# Patient Record
Sex: Female | Born: 1985 | Hispanic: Yes | Marital: Married | Smoking: Never smoker
Health system: Southern US, Community
[De-identification: ages and names within clinical notes are randomized; demographics above are authoritative.]

## PROBLEM LIST (undated history)

## (undated) ENCOUNTER — Inpatient Hospital Stay (HOSPITAL_COMMUNITY): Payer: Self-pay

## (undated) DIAGNOSIS — R0602 Shortness of breath: Secondary | ICD-10-CM

## (undated) DIAGNOSIS — F329 Major depressive disorder, single episode, unspecified: Secondary | ICD-10-CM

## (undated) DIAGNOSIS — F32A Depression, unspecified: Secondary | ICD-10-CM

## (undated) HISTORY — DX: Shortness of breath: R06.02

## (undated) HISTORY — DX: Depression, unspecified: F32.A

## (undated) HISTORY — DX: Major depressive disorder, single episode, unspecified: F32.9

---

## 2005-08-07 ENCOUNTER — Inpatient Hospital Stay (HOSPITAL_COMMUNITY): Admission: AD | Admit: 2005-08-07 | Discharge: 2005-08-07 | Payer: Self-pay | Admitting: Obstetrics and Gynecology

## 2006-03-30 ENCOUNTER — Inpatient Hospital Stay (HOSPITAL_COMMUNITY): Admission: AD | Admit: 2006-03-30 | Discharge: 2006-04-02 | Payer: Self-pay | Admitting: Obstetrics

## 2006-05-21 ENCOUNTER — Emergency Department (HOSPITAL_COMMUNITY): Admission: EM | Admit: 2006-05-21 | Discharge: 2006-05-21 | Payer: Self-pay | Admitting: Emergency Medicine
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
## 2010-12-06 ENCOUNTER — Inpatient Hospital Stay (HOSPITAL_COMMUNITY): Payer: Self-pay

## 2010-12-06 ENCOUNTER — Inpatient Hospital Stay (HOSPITAL_COMMUNITY)
Admission: AD | Admit: 2010-12-06 | Discharge: 2010-12-06 | Disposition: A | Discharge status: Home / Self Care | Payer: Self-pay | Source: Ambulatory Visit | Attending: Obstetrics & Gynecology | Admitting: Obstetrics & Gynecology

## 2010-12-06 ENCOUNTER — Encounter (HOSPITAL_COMMUNITY): Payer: Self-pay | Admitting: *Deleted

## 2010-12-06 DIAGNOSIS — R109 Unspecified abdominal pain: Secondary | ICD-10-CM | POA: Insufficient documentation

## 2010-12-06 DIAGNOSIS — T8332XA Displacement of intrauterine contraceptive device, initial encounter: Secondary | ICD-10-CM

## 2010-12-06 DIAGNOSIS — Z30431 Encounter for routine checking of intrauterine contraceptive device: Secondary | ICD-10-CM | POA: Insufficient documentation

## 2010-12-06 LAB — WET PREP, GENITAL: Clue Cells Wet Prep HPF POC: NONE SEEN

## 2010-12-06 LAB — URINALYSIS, ROUTINE W REFLEX MICROSCOPIC
Bilirubin Urine: NEGATIVE
Protein, ur: NEGATIVE mg/dL
Specific Gravity, Urine: 1.01 (ref 1.005–1.030)
Urobilinogen, UA: 0.2 mg/dL (ref 0.0–1.0)

## 2010-12-06 LAB — URINE MICROSCOPIC-ADD ON

## 2010-12-06 LAB — POCT PREGNANCY, URINE: Preg Test, Ur: NEGATIVE

## 2010-12-06 MED ORDER — KETOROLAC TROMETHAMINE 60 MG/2ML IM SOLN
60.0000 mg | Freq: Once | INTRAMUSCULAR | Status: AC
  Administered 2010-12-06: 60 mg via INTRAMUSCULAR
  Filled 2010-12-06: qty 2

## 2010-12-06 MED ORDER — CIPROFLOXACIN HCL 500 MG PO TABS: 500.0000 mg | ORAL_TABLET | Freq: Two times a day (BID) | ORAL | Status: DC

## 2010-12-06 MED ORDER — IBUPROFEN 200 MG PO TABS: 600.0000 mg | ORAL_TABLET | Freq: Four times a day (QID) | ORAL | Status: DC | PRN

## 2010-12-06 MED ORDER — HYDROCODONE-ACETAMINOPHEN 5-325 MG PO TABS: 2.0000 | ORAL_TABLET | ORAL | Status: AC | PRN

## 2010-12-06 NOTE — ED Provider Notes (Signed)
History   Pt presents today c/o lower abd pain and cramping that has worsened over the past 4 months. She states she thought her IUD was causing the pain so she went to the HD to have it removed but they could not find the strings. She states the pain has progressed and is especially bad during her menses and is located mainly on her left side. She is currently on her menses and she desires to have her IUD removed. She denies fever, vag irritation, or any other sx at this time.  Chief Complaint  Patient presents with  . Abdominal Pain   HPI  OB History    Grav Para Term Preterm Abortions TAB SAB Ect Mult Living   1 1 1       1       Past Medical History  Diagnosis Date  . No pertinent past medical history     Past Surgical History  Procedure Date  . Cesarean section     No family history on file.  History  Substance Use Topics  . Smoking status: Never Smoker   . Smokeless tobacco: Not on file  . Alcohol Use: No    Allergies: No Known Allergies  Prescriptions prior to admission  Medication Sig Dispense Refill  . ibuprofen (ADVIL,MOTRIN) 200 MG tablet Take 200 mg by mouth every 6 (six) hours as needed. Pain in stomach area         Review of Systems  Constitutional: Negative for fever.  Cardiovascular: Negative for chest pain.  Gastrointestinal: Positive for abdominal pain. Negative for nausea, vomiting, diarrhea and constipation.  Genitourinary: Negative for dysuria, urgency, frequency and hematuria.  Neurological: Negative for dizziness and headaches.  Psychiatric/Behavioral: Negative for depression and suicidal ideas.   Physical Exam   Blood pressure 99/58, pulse 71, temperature 98.9 F (37.2 C), temperature source Oral, resp. rate 18, height 4\' 10"  (1.473 m), weight 144 lb (65.318 kg), last menstrual period 12/02/2010, SpO2 99.00%.  Physical Exam  Nursing note and vitals reviewed. Constitutional: She is oriented to person, place, and time. She appears  well-developed and well-nourished. No distress.  HENT:  Head: Normocephalic and atraumatic.  Eyes: EOM are normal. Pupils are equal, round, and reactive to light.  GI: Soft. She exhibits no distension. There is tenderness (Pt tender over uterus.). There is no rebound and no guarding.  Genitourinary: There is bleeding around the vagina. Vaginal discharge found.       Cervix easily visualized. IUD strings not seen. Uterus tender to palpation. No adnexal masses.  Neurological: She is alert and oriented to person, place, and time.  Skin: Skin is warm and dry. She is not diaphoretic.  Psychiatric: She has a normal mood and affect. Her behavior is normal. Judgment and thought content normal.    MAU Course  Procedures  US Transvaginal Non-ob  12/06/2010  *RADIOLOGY REPORT*  Clinical Data: Pelvic pain.  Patient with IUD.  TRANSABDOMINAL AND TRANSVAGINAL ULTRASOUND OF PELVIS Technique:  Both transabdominal and transvaginal ultrasound examinations of the pelvis were performed. Transabdominal technique was performed for global imaging of the pelvis including uterus, ovaries, adnexal regions, and pelvic cul-de-sac.  Comparison: None.   It was necessary to proceed with endovaginal exam following the transabdominal exam to visualize the ovaries.  Findings:  Uterus: Normal in size and appearance.  Endometrium: IUD in place and appears probably positioned. Endometrial stripe thickness 3.2 mm.  Right ovary:  Normal appearance/no adnexal mass  Left ovary: Normal appearance/no adnexal mass  Other findings: No free fluid  IMPRESSION: Normal study. No evidence of pelvic mass or other significant abnormality. IUD noted.  Original Report Authenticated By: Bernadene Bell. Maricela Curet, M.D.   US Pelvis Complete  12/06/2010  *RADIOLOGY REPORT*  Clinical Data: Pelvic pain.  Patient with IUD.  TRANSABDOMINAL AND TRANSVAGINAL ULTRASOUND OF PELVIS Technique:  Both transabdominal and transvaginal ultrasound examinations of the pelvis  were performed. Transabdominal technique was performed for global imaging of the pelvis including uterus, ovaries, adnexal regions, and pelvic cul-de-sac.  Comparison: None.   It was necessary to proceed with endovaginal exam following the transabdominal exam to visualize the ovaries.  Findings:  Uterus: Normal in size and appearance.  Endometrium: IUD in place and appears probably positioned. Endometrial stripe thickness 3.2 mm.  Right ovary:  Normal appearance/no adnexal mass  Left ovary: Normal appearance/no adnexal mass  Other findings: No free fluid  IMPRESSION: Normal study. No evidence of pelvic mass or other significant abnormality. IUD noted.  Original Report Authenticated By: Bernadene Bell. Maricela Curet, M.D.   IUD removal was attempted but unsuccessful. Dr. Emelda Fear notified and will attempt removal.  Pt pain is now zero following IM Toradol.  Assessment and Plan  Care of this pt was transferred to Georges Mouse, CNM at 8:14pm.  Clinton Gallant. Rice III, DrHSc, MPAS, PA-C  12/06/2010, 8:09 PM   Henrietta Hoover, PA 12/06/10 2014

## 2010-12-06 NOTE — Progress Notes (Signed)
IUD placed July,2008.  Pain down legs, and in lower abd.  Feels like bloating.  Went to [facility]- asked them to remove it- unable to see strings.  Hurts when walks.

## 2010-12-06 NOTE — ED Provider Notes (Signed)
Chief Complaint:  Abdominal Pain   Megan Buchanan is  25 y.o. G1P1001.  Patient's last menstrual period was 12/02/2010.Marland Kitchen  Her pregnancy status is negative.  She presents complaining of Abdominal Pain . As described by Dr. Henrietta Hoover, patient desires IUD  to be removed timeout is performed and verbal consent obtained the patient with a nurse as witness been explained to the patient including plans for paracervical and intracervical block patient denied allergies.. Cervix was cleansed and intracervical block placed on the anterior lip  of the cervix. single-tooth tenaculum used to grasp the cervix without discomfort. Paracervical block using 20 cc of lidocaine 1% used at 3,5,7, 9:00, patient none allowed probing of the uterine cavity with uterine packing forceps area in mild discomfort was all she experienced but at no time could the IUD string or IUD itself the identified and grasped with the uterine packing forceps. After several minutes of attempts to 7 cm in depth, procedure was discontinued patient tolerated the procedure well with no complications and 15-25 cc blood loss Cipro 500 mg orally will be administered prior to discharge patient will be given pain medications and scheduled for followup at the GYN clinic   Past Medical History  Diagnosis Date  . No pertinent past medical history     Past Surgical History  Procedure Date  . Cesarean section     No family history on file.  History  Substance Use Topics  . Smoking status: Never Smoker   . Smokeless tobacco: Not on file  . Alcohol Use: No    Allergies: No Known Allergies  Prescriptions prior to admission  Medication Sig Dispense Refill  . ibuprofen (ADVIL,MOTRIN) 200 MG tablet Take 200 mg by mouth every 6 (six) hours as needed. Pain in stomach area         Review of Systems - Negative except as listed by hpi  Physical Exam   Blood pressure 99/58, pulse 71, temperature 98.9 F (37.2 C), temperature source Oral,  resp. rate 18, height 4\' 10"  (1.473 m), weight 65.318 kg (144 lb), last menstrual period 12/02/2010, SpO2 99.00%.  General: General appearance - alert, well appearing, and in no distress Chest - clear to auscultation, no wheezes, rales or rhonchi, symmetric air entry HAbdomen - soft, nontender, nondistended, no masses or organomegaly no rebound tenderness noted Pelvic - VULVA: normal appearing vulva with no masses, tenderness or lesions Extremities - peripheral pulses normal, no pedal edema, no clubbing or cyanosis, not examined  Ultrasound reveals a retroverted retroflexed uterus with IUD in position cervix was show evidence of identifiable IUD string. Labs: Recent Results (from the past 24 hour(s))  URINALYSIS, ROUTINE W REFLEX MICROSCOPIC   Collection Time   12/06/10  4:25 PM      Component Value Range   Color, Urine YELLOW  YELLOW    Appearance HAZY (*) CLEAR    Specific Gravity, Urine 1.010  1.005 - 1.030    pH 7.0  5.0 - 8.0    Glucose, UA NEGATIVE  NEGATIVE (mg/dL)   Hgb urine dipstick LARGE (*) NEGATIVE    Bilirubin Urine NEGATIVE  NEGATIVE    Ketones, ur NEGATIVE  NEGATIVE (mg/dL)   Protein, ur NEGATIVE  NEGATIVE (mg/dL)   Urobilinogen, UA 0.2  0.0 - 1.0 (mg/dL)   Nitrite NEGATIVE  NEGATIVE    Leukocytes, UA TRACE (*) NEGATIVE   URINE MICROSCOPIC-ADD ON   Collection Time   12/06/10  4:25 PM      Component Value Range  Squamous Epithelial / LPF FEW (*) RARE    WBC, UA 0-2  <3 (WBC/hpf)   Bacteria, UA FEW (*) RARE   WET PREP, GENITAL   Collection Time   12/06/10  4:50 PM      Component Value Range   Yeast, Wet Prep NONE SEEN  NONE SEEN    Trich, Wet Prep NONE SEEN  NONE SEEN    Clue Cells, Wet Prep NONE SEEN  NONE SEEN    WBC, Wet Prep HPF POC FEW (*) NONE SEEN   POCT PREGNANCY, URINE   Collection Time   12/06/10  4:57 PM      Component Value Range   Preg Test, Ur NEGATIVE     Imaging Studies:  US Transvaginal Non-ob  12/06/2010  *RADIOLOGY REPORT*   Clinical Data: Pelvic pain.  Patient with IUD.  TRANSABDOMINAL AND TRANSVAGINAL ULTRASOUND OF PELVIS Technique:  Both transabdominal and transvaginal ultrasound examinations of the pelvis were performed. Transabdominal technique was performed for global imaging of the pelvis including uterus, ovaries, adnexal regions, and pelvic cul-de-sac.  Comparison: None.   It was necessary to proceed with endovaginal exam following the transabdominal exam to visualize the ovaries.  Findings:  Uterus: Normal in size and appearance.  Endometrium: IUD in place and appears probably positioned. Endometrial stripe thickness 3.2 mm.  Right ovary:  Normal appearance/no adnexal mass  Left ovary: Normal appearance/no adnexal mass  Other findings: No free fluid  IMPRESSION: Normal study. No evidence of pelvic mass or other significant abnormality. IUD noted.  Original Report Authenticated By: Bernadene Bell. Maricela Curet, M.D.   US Pelvis Complete  12/06/2010  *RADIOLOGY REPORT*  Clinical Data: Pelvic pain.  Patient with IUD.  TRANSABDOMINAL AND TRANSVAGINAL ULTRASOUND OF PELVIS Technique:  Both transabdominal and transvaginal ultrasound examinations of the pelvis were performed. Transabdominal technique was performed for global imaging of the pelvis including uterus, ovaries, adnexal regions, and pelvic cul-de-sac.  Comparison: None.   It was necessary to proceed with endovaginal exam following the transabdominal exam to visualize the ovaries.  Findings:  Uterus: Normal in size and appearance.  Endometrium: IUD in place and appears probably positioned. Endometrial stripe thickness 3.2 mm.  Right ovary:  Normal appearance/no adnexal mass  Left ovary: Normal appearance/no adnexal mass  Other findings: No free fluid  IMPRESSION: Normal study. No evidence of pelvic mass or other significant abnormality. IUD noted.  Original Report Authenticated By: Bernadene Bell. Maricela Curet, M.D.     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   Plan: Plan Cipro 500 mg orally administered due to uterine manipulation Followup in GYN clinic Motrin 600 mg x30 tablets prescribed as well as hydrocodone 5/325 for an intermittent discomfort   Carita Sollars V

## 2010-12-06 NOTE — Progress Notes (Signed)
Frequency with urination, painful, feels better after

## 2010-12-06 NOTE — Progress Notes (Signed)
Pain past 3 months- building

## 2010-12-11 NOTE — ED Provider Notes (Signed)
Attestation of Attending Supervision of Advanced Practitioner: Evaluation and management procedures were performed by the PA/NP/CNM/OB Fellow under my supervision/collaboration. Chart reviewed and agree with management and plan. I attempted to locate the IUD after intracervical and paracervical block.  Uterus was retroverted, sounded to 7 cm, without any success identifying the IUD with the Uterine Packing/Grasping forceps. See note in [facility] completion report. Tilda Burrow 12/11/2010 6:36 PM

## 2011-01-17 ENCOUNTER — Ambulatory Visit (INDEPENDENT_AMBULATORY_CARE_PROVIDER_SITE_OTHER): Payer: Self-pay | Admitting: Obstetrics & Gynecology

## 2011-01-17 ENCOUNTER — Encounter: Payer: Self-pay | Admitting: Family

## 2011-01-17 VITALS — BP 131/82 | HR 93 | Temp 97.5°F | Ht 59.0 in | Wt 145.2 lb

## 2011-01-17 DIAGNOSIS — T839XXA Unspecified complication of genitourinary prosthetic device, implant and graft, initial encounter: Secondary | ICD-10-CM

## 2011-01-17 DIAGNOSIS — Z30432 Encounter for removal of intrauterine contraceptive device: Secondary | ICD-10-CM

## 2011-01-17 DIAGNOSIS — T8389XA Other specified complication of genitourinary prosthetic devices, implants and grafts, initial encounter: Secondary | ICD-10-CM

## 2011-01-17 MED ORDER — IBUPROFEN 200 MG PO TABS
600.0000 mg | ORAL_TABLET | Freq: Once | ORAL | Status: AC
  Administered 2011-01-17: 600 mg via ORAL

## 2011-01-17 NOTE — Progress Notes (Signed)
History:  26 y.o. G1P1001 here today for IUD removal.  She has had the IUD in for about 5 years, it was unable to be removed in the [facility] and MAU.  She is on OCPs for contraception. IUD was visualized in the uterus on 12/06/10 scan. Spanish interpreter present for this encounter.  The following portions of the patient's history were reviewed and updated as appropriate: allergies, current medications, past family history, past medical history, past social history, past surgical history and problem list.   Objective:  Physical Exam Blood pressure 131/82, pulse 93, temperature 97.5 F (36.4 C), temperature source Oral, height 4\' 11"  (1.499 m), weight 145 lb 3.2 oz (65.862 kg), last menstrual period 01/04/2011. Gen: NAD Abd: Soft, nontender and nondistended Pelvic: Normal appearing external genitalia; normal appearing vaginal mucosa and cervix.  Normal discharge.  Small uterus, no palpable masses or adnexal tenderness.  IUD Removal Attempt Patient was in the dorsal lithotomy position, normal external genitalia was noted.  A speculum was placed in the patient's vagina, normal discharge was noted, no lesions. The multiparous cervix was visualized, no lesions, no abnormal discharge, and was swabbed with Betadine using scopettes.  The strings of the IUD were not visualized.  Kelly forceps were introduced into the uterine cavity but the IUD was not palpated.  The IUD hook was also used, the IUD was palpated but was unable to grasped and pulled.  Multiple attempts were made but were unsuccessful.  Patient had a significant amount of pain and discomfort during the procedure, the procedure was aborted. Will have to re-attempt this removal under anesthesia in the OR.  Ibuprofen 600 mg po x1 given to patient for her pain.  Labs and Imaging 12/06/10 TRANSABDOMINAL AND TRANSVAGINAL ULTRASOUND OF PELVIS  Uterus: Normal in size and appearance. Endometrium: IUD in place and appears probably positioned. Endometrial  stripe thickness 3.2 mm.  Right ovary: Normal appearance/no adnexal mass.  Left ovary: Normal appearance/no adnexal mass. Other findings: No free fluid  IMPRESSION: Normal study. No evidence of pelvic mass or other significant abnormality. IUD noted.  Assessment & Plan:  Patient will be scheduled for hysteroscopy, removal of retained IUD in the OR soon.  Risks of procedure reviewed. She will be contacted by our scheduler with the time and date of the procedure.

## 2011-01-22 ENCOUNTER — Encounter (HOSPITAL_COMMUNITY)
Admission: RE | Admit: 2011-01-22 | Discharge: 2011-01-22 | Disposition: A | Discharge status: Home / Self Care | Payer: Self-pay | Source: Ambulatory Visit | Attending: Obstetrics & Gynecology | Admitting: Obstetrics & Gynecology

## 2011-01-22 ENCOUNTER — Encounter (HOSPITAL_COMMUNITY): Payer: Self-pay

## 2011-01-22 LAB — CBC
Hemoglobin: 13 g/dL (ref 12.0–15.0)
MCHC: 35.9 g/dL (ref 30.0–36.0)
Platelets: 260 10*3/uL (ref 150–400)
RDW: 12.8 % (ref 11.5–15.5)

## 2011-01-22 NOTE — Patient Instructions (Addendum)
   Your procedure is scheduled on: Wednesday, Jan 9th  Enter through the [employer] of [facility] at: 9:45am  Pick up the phone at the desk and dial [phone] and inform us of your arrival.  Please call this number if you have any problems the morning of surgery: [phone]  Remember: Do not eat food after midnight: Tuesday Do not drink clear liquids after:  Tuesdaty Take these medicines the morning of surgery with a SIP OF WATER: none  Do not wear jewelry, make-up, or FINGER nail polish Do not wear lotions, powders, perfumes or deodorant. Do not shave 48 hours prior to surgery. Do not bring valuables to the hospital.  Patients discharged on the day of surgery will not be allowed to drive home.   Home with [name]  cell [phone].   Remember to use your hibiclens as instructed.Please shower with 1/2 bottle the evening before your surgery and the other 1/2 bottle the morning of surgery.

## 2011-01-22 NOTE — Pre-Procedure Instructions (Signed)
Ok to see patient DOS. 

## 2011-01-24 ENCOUNTER — Encounter (HOSPITAL_COMMUNITY): Payer: Self-pay | Admitting: *Deleted

## 2011-01-24 ENCOUNTER — Ambulatory Visit (HOSPITAL_COMMUNITY)
Admission: RE | Admit: 2011-01-24 | Discharge: 2011-01-24 | Disposition: A | Discharge status: Home / Self Care | Payer: Self-pay | Source: Ambulatory Visit | Attending: Obstetrics & Gynecology | Admitting: Obstetrics & Gynecology

## 2011-01-24 ENCOUNTER — Encounter (HOSPITAL_COMMUNITY): Payer: Self-pay | Admitting: Anesthesiology

## 2011-01-24 ENCOUNTER — Ambulatory Visit (HOSPITAL_COMMUNITY): Payer: Self-pay | Admitting: Anesthesiology

## 2011-01-24 ENCOUNTER — Encounter (HOSPITAL_COMMUNITY)
Admission: RE | Disposition: A | Discharge status: Home / Self Care | Payer: Self-pay | Source: Ambulatory Visit | Attending: Obstetrics & Gynecology

## 2011-01-24 DIAGNOSIS — Z30432 Encounter for removal of intrauterine contraceptive device: Secondary | ICD-10-CM | POA: Insufficient documentation

## 2011-01-24 DIAGNOSIS — T839XXA Unspecified complication of genitourinary prosthetic device, implant and graft, initial encounter: Secondary | ICD-10-CM

## 2011-01-24 DIAGNOSIS — T8389XA Other specified complication of genitourinary prosthetic devices, implants and grafts, initial encounter: Secondary | ICD-10-CM

## 2011-01-24 DIAGNOSIS — Z01812 Encounter for preprocedural laboratory examination: Secondary | ICD-10-CM | POA: Insufficient documentation

## 2011-01-24 DIAGNOSIS — Z01818 Encounter for other preprocedural examination: Secondary | ICD-10-CM | POA: Insufficient documentation

## 2011-01-24 HISTORY — PX: IUD REMOVAL: SHX5392

## 2011-01-24 HISTORY — PX: HYSTEROSCOPY W/D&C: SHX1775

## 2011-01-24 LAB — PREGNANCY, URINE: Preg Test, Ur: NEGATIVE

## 2011-01-24 SURGERY — DILATATION AND CURETTAGE /HYSTEROSCOPY
Anesthesia: General | Site: Uterus | Wound class: Clean Contaminated

## 2011-01-24 MED ORDER — OXYCODONE-ACETAMINOPHEN 5-325 MG PO TABS: 1.0000 | ORAL_TABLET | Freq: Four times a day (QID) | ORAL | Status: AC | PRN

## 2011-01-24 MED ORDER — LIDOCAINE HCL (CARDIAC) 20 MG/ML IV SOLN
INTRAVENOUS | Status: AC
  Filled 2011-01-24: qty 5

## 2011-01-24 MED ORDER — MIDAZOLAM HCL 2 MG/2ML IJ SOLN
INTRAMUSCULAR | Status: AC
  Filled 2011-01-24: qty 2

## 2011-01-24 MED ORDER — ONDANSETRON HCL 4 MG/2ML IJ SOLN
INTRAMUSCULAR | Status: AC
  Filled 2011-01-24: qty 2

## 2011-01-24 MED ORDER — MIDAZOLAM HCL 5 MG/5ML IJ SOLN
INTRAMUSCULAR | Status: DC | PRN
  Administered 2011-01-24: 2 mg via INTRAVENOUS

## 2011-01-24 MED ORDER — DEXAMETHASONE SODIUM PHOSPHATE 10 MG/ML IJ SOLN
INTRAMUSCULAR | Status: AC
  Filled 2011-01-24: qty 1

## 2011-01-24 MED ORDER — PROPOFOL 10 MG/ML IV EMUL
INTRAVENOUS | Status: AC
  Filled 2011-01-24: qty 20

## 2011-01-24 MED ORDER — DOCUSATE SODIUM 100 MG PO CAPS: 100.0000 mg | ORAL_CAPSULE | Freq: Two times a day (BID) | ORAL | Status: AC | PRN

## 2011-01-24 MED ORDER — IBUPROFEN 600 MG PO TABS: 600.0000 mg | ORAL_TABLET | Freq: Four times a day (QID) | ORAL | Status: DC | PRN

## 2011-01-24 MED ORDER — EPHEDRINE 5 MG/ML INJ
INTRAVENOUS | Status: AC
  Filled 2011-01-24: qty 10

## 2011-01-24 MED ORDER — FENTANYL CITRATE 0.05 MG/ML IJ SOLN
INTRAMUSCULAR | Status: AC
  Filled 2011-01-24: qty 2

## 2011-01-24 MED ORDER — BUPIVACAINE HCL (PF) 0.25 % IJ SOLN
INTRAMUSCULAR | Status: DC | PRN
  Administered 2011-01-24: 30 mL

## 2011-01-24 MED ORDER — EPHEDRINE SULFATE 50 MG/ML IJ SOLN
INTRAMUSCULAR | Status: DC | PRN
  Administered 2011-01-24 (×2): 10 mg via INTRAVENOUS

## 2011-01-24 MED ORDER — FENTANYL CITRATE 0.05 MG/ML IJ SOLN
INTRAMUSCULAR | Status: AC
  Filled 2011-01-24: qty 5

## 2011-01-24 MED ORDER — GLYCINE 1.5 % IR SOLN
Status: DC | PRN
  Administered 2011-01-24: 3000 mL

## 2011-01-24 MED ORDER — PROPOFOL 10 MG/ML IV EMUL
INTRAVENOUS | Status: DC | PRN
  Administered 2011-01-24: 200 mg via INTRAVENOUS

## 2011-01-24 MED ORDER — LIDOCAINE HCL (CARDIAC) 20 MG/ML IV SOLN
INTRAVENOUS | Status: DC | PRN
  Administered 2011-01-24: 100 mg via INTRAVENOUS

## 2011-01-24 MED ORDER — LACTATED RINGERS IV SOLN
INTRAVENOUS | Status: DC
  Administered 2011-01-24: 12:00:00 via INTRAVENOUS
  Administered 2011-01-24: 125 mL/h via INTRAVENOUS
  Administered 2011-01-24: 11:00:00 via INTRAVENOUS

## 2011-01-24 MED ORDER — DEXAMETHASONE SODIUM PHOSPHATE 10 MG/ML IJ SOLN
INTRAMUSCULAR | Status: DC | PRN
  Administered 2011-01-24: 10 mg via INTRAVENOUS

## 2011-01-24 MED ORDER — KETOROLAC TROMETHAMINE 30 MG/ML IJ SOLN
INTRAMUSCULAR | Status: AC
  Filled 2011-01-24: qty 1

## 2011-01-24 MED ORDER — LACTATED RINGERS IV SOLN: INTRAVENOUS | Status: DC

## 2011-01-24 MED ORDER — FENTANYL CITRATE 0.05 MG/ML IJ SOLN
INTRAMUSCULAR | Status: DC | PRN
  Administered 2011-01-24: 100 ug via INTRAVENOUS

## 2011-01-24 SURGICAL SUPPLY — 14 items
CANISTER SUCTION 2500CC (MISCELLANEOUS) ×2 IMPLANT
CATH ROBINSON RED A/P 16FR (CATHETERS) ×2 IMPLANT
CLOTH BEACON ORANGE TIMEOUT ST (SAFETY) ×2 IMPLANT
CONTAINER PREFILL 10% NBF 60ML (FORM) ×3 IMPLANT
DRAPE PROXIMA HALF (DRAPES) ×2 IMPLANT
ELECT LOOP GYNE PRO 24FR (CUTTING LOOP)
ELECTRODE LOOP GYNE PRO 24FR (CUTTING LOOP) IMPLANT
GLOVE BIO SURGEON STRL SZ7 (GLOVE) ×2 IMPLANT
GOWN PREVENTION PLUS LG XLONG (DISPOSABLE) ×3 IMPLANT
GOWN STRL REIN XL XLG (GOWN DISPOSABLE) ×2 IMPLANT
LOOP ANGLED CUTTING 22FR (CUTTING LOOP) IMPLANT
PACK HYSTEROSCOPY LF (CUSTOM PROCEDURE TRAY) ×2 IMPLANT
TOWEL OR 17X24 6PK STRL BLUE (TOWEL DISPOSABLE) ×4 IMPLANT
WATER STERILE IRR 1000ML POUR (IV SOLUTION) ×2 IMPLANT

## 2011-01-24 NOTE — Interval H&P Note (Signed)
History and Physical Interval Note:  01/24/2011 9:15 AM  Megan Buchanan  has presented today for surgery, with the diagnosis of Retained IUD. She is not pregnant. Spanish interpreter present during this encounter. After consideration of risks, benefits and other options for treatment, the patient has consented to  Procedure(s): Hysteroscopy, removal of retained IUD as a surgical intervention .  The patients' history has been reviewed, patient examined, no change in status, stable for surgery.  I have reviewed the patients' chart and labs.  Questions were answered to the patient's satisfaction.    Jaynie Collins A MD

## 2011-01-24 NOTE — Transfer of Care (Signed)
Immediate Anesthesia Transfer of Care Note  Patient: Megan Buchanan  Procedure(s) Performed:  DILATATION AND CURETTAGE /HYSTEROSCOPY - Hysteroscopy only; INTRAUTERINE DEVICE (IUD) REMOVAL  Patient Location: PACU  Anesthesia Type: General  Level of Consciousness: awake and alert   Airway & Oxygen Therapy: Patient connected to nasal cannula oxygen  Post-op Assessment: Report given to PACU RN  Post vital signs: Reviewed and stable  Complications: No apparent anesthesia complications

## 2011-01-24 NOTE — Op Note (Signed)
PREOPERATIVE DIAGNOSIS:  Retained Mirena IUD, failed attempts to remove it in office POSTOPERATIVE DIAGNOSIS: The same PROCEDURE: Operative Hysteroscopy, Removal of retained IUD SURGEON:  Dr. Jaynie Collins   INDICATIONS: 26 y.o. G1P1001  here for scheduled surgery for removal of retained IUD.   Risks of surgery were discussed with the patient including but not limited to: bleeding which may require transfusion; infection which may require antibiotics; injury to uterus or surrounding organs; need for additional procedures including laparotomy or laparoscopy; and other postoperative/anesthesia complications. Written informed consent was obtained.    FINDINGS:  A 10 week size uterus.  IUD seen slightly embedded in the upper fundal area, able to be removed using hysteroscopic graspers. Normal endometrium.  Normal ostia bilaterally.  ANESTHESIA:   General, paracervical block. INTRAVENOUS FLUIDS:  of LR FLUID DEFICITS:  of Glycine ESTIMATED BLOOD LOSS:  5 ml SPECIMENS: None COMPLICATIONS:  None immediate.  PROCEDURE DETAILS:  The patient was taken to the operating room where general anesthesia was administered and was found to be adequate.  After an adequate timeout was performed, she was placed in the dorsal lithotomy position and examined; then prepped and draped in the sterile manner.   Her bladder was catheterized for about 150 ml of clear, yellow urine. A speculum was then placed in the patient's vagina and a single tooth tenaculum was applied to the anterior lip of the cervix.   A paracervical block using 30 ml of 0.25% Marcaine was administered.  The cervix was sounded to 10 cm and dilated manually with metal dilators to accommodate the 5 mm diagnostic hysteroscope.  Once the cervix was dilated, the hysteroscope was inserted under direct visualization using glycine as a suspension medium.  The uterine cavity was carefully examined, with the findings as mentioned above.  Hysteroscopic  graspers were used to grasp the IUD and it was removed intact from the uterus.   After further careful visualization of the uterine cavity, the hysteroscope was removed under direct visualization.  The tenaculum was removed from the anterior lip of the cervix and the vaginal speculum was removed after noting good hemostasis.  The patient tolerated the procedure well and was taken to the recovery area awake, extubated and in stable condition.  The patient will be discharged to home as per PACU criteria.  Routine postoperative instructions given.  She was prescribed Percocet, Ibuprofen and Colace.

## 2011-01-24 NOTE — Anesthesia Preprocedure Evaluation (Addendum)
Anesthesia Evaluation  Patient identified by MRN, date of birth, ID band Patient awake    Reviewed: Allergy & Precautions, H&P , NPO status , Patient's Chart, lab work & pertinent test results  Airway Mallampati: I      Dental No notable dental hx.    Pulmonary neg pulmonary ROS,    Pulmonary exam normal       Cardiovascular neg cardio ROS     Neuro/Psych Negative Neurological ROS     GI/Hepatic negative GI ROS, Neg liver ROS,   Endo/Other  Negative Endocrine ROS  Renal/GU negative Renal ROS  Genitourinary negative   Musculoskeletal negative musculoskeletal ROS (+)   Abdominal Normal abdominal exam  (+)   Peds negative pediatric ROS (+)  Hematology negative hematology ROS (+)   Anesthesia Other Findings   Reproductive/Obstetrics negative OB ROS                           Anesthesia Physical Anesthesia Plan  ASA: I  Anesthesia Plan: General   Post-op Pain Management:    Induction:   Airway Management Planned: LMA  Additional Equipment:   Intra-op Plan:   Post-operative Plan:   Informed Consent:   Plan Discussed with:   Anesthesia Plan Comments:         Anesthesia Quick Evaluation

## 2011-01-24 NOTE — H&P (View-Only) (Signed)
History:  26 y.o. G1P1001 here today for IUD removal.  She has had the IUD in for about 5 years, it was unable to be removed in the [facility] and [facility].  She is on OCPs for contraception. IUD was visualized in the uterus on 12/06/10 scan. Spanish interpreter present for this encounter.  The following portions of the patient's history were reviewed and updated as appropriate: allergies, current medications, past family history, past medical history, past social history, past surgical history and problem list.   Objective:  Physical Exam Blood pressure 131/82, pulse 93, temperature 97.5 F (36.4 C), temperature source Oral, height 4' 11" (1.499 m), weight 145 lb 3.2 oz (65.862 kg), last menstrual period 01/04/2011. Gen: NAD Abd: Soft, nontender and nondistended Pelvic: Normal appearing external genitalia; normal appearing vaginal mucosa and cervix.  Normal discharge.  Small uterus, no palpable masses or adnexal tenderness.  IUD Removal Attempt Patient was in the dorsal lithotomy position, normal external genitalia was noted.  A speculum was placed in the patient's vagina, normal discharge was noted, no lesions. The multiparous cervix was visualized, no lesions, no abnormal discharge, and was swabbed with Betadine using scopettes.  The strings of the IUD were not visualized.  Kelly forceps were introduced into the uterine cavity but the IUD was not palpated.  The IUD hook was also used, the IUD was palpated but was unable to grasped and pulled.  Multiple attempts were made but were unsuccessful.  Patient had a significant amount of pain and discomfort during the procedure, the procedure was aborted. Will have to re-attempt this removal under anesthesia in the OR.  Ibuprofen 600 mg po x1 given to patient for her pain.  Labs and Imaging 12/06/10 TRANSABDOMINAL AND TRANSVAGINAL ULTRASOUND OF PELVIS  Uterus: Normal in size and appearance. Endometrium: IUD in place and appears probably positioned. Endometrial  stripe thickness 3.2 mm.  Right ovary: Normal appearance/no adnexal mass.  Left ovary: Normal appearance/no adnexal mass. Other findings: No free fluid  IMPRESSION: Normal study. No evidence of pelvic mass or other significant abnormality. IUD noted.  Assessment & Plan:  Patient will be scheduled for hysteroscopy, removal of retained IUD in the OR soon.  Risks of procedure reviewed. She will be contacted by our scheduler with the time and date of the procedure. 

## 2011-01-24 NOTE — Anesthesia Procedure Notes (Signed)
Procedure Name: LMA Insertion Date/Time: 01/24/2011 11:42 AM Performed by: Irean Kendricks MARIE Pre-anesthesia Checklist: Patient identified, Timeout performed, Emergency Drugs available, Suction available and Patient being monitored Patient Re-evaluated:Patient Re-evaluated prior to inductionOxygen Delivery Method: Circle System Utilized Intubation Type: IV induction LMA: LMA inserted LMA Size: 4.0 Dental Injury: Teeth and Oropharynx as per pre-operative assessment

## 2011-01-24 NOTE — Anesthesia Postprocedure Evaluation (Signed)
Anesthesia Post Note  Patient: Megan Buchanan  Procedure(s) Performed:  DILATATION AND CURETTAGE /HYSTEROSCOPY - Hysteroscopy only; INTRAUTERINE DEVICE (IUD) REMOVAL  Anesthesia type: General  Patient location: PACU  Post pain: Pain level controlled  Post assessment: Post-op Vital signs reviewed  Last Vitals:  Filed Vitals:   01/24/11 1230  BP: 108/70  Pulse: 97  Temp:   Resp: 16    Post vital signs: Reviewed  Level of consciousness: sedated  Complications: No apparent anesthesia complicationsfj

## 2011-01-25 ENCOUNTER — Encounter (HOSPITAL_COMMUNITY): Payer: Self-pay | Admitting: Obstetrics & Gynecology

## 2011-10-04 ENCOUNTER — Emergency Department (HOSPITAL_COMMUNITY)
Admission: EM | Admit: 2011-10-04 | Discharge: 2011-10-05 | Disposition: A | Discharge status: Home / Self Care | Payer: Self-pay | Attending: Emergency Medicine | Admitting: Emergency Medicine

## 2011-10-04 ENCOUNTER — Encounter (HOSPITAL_COMMUNITY): Payer: Self-pay | Admitting: *Deleted

## 2011-10-04 DIAGNOSIS — N72 Inflammatory disease of cervix uteri: Secondary | ICD-10-CM | POA: Insufficient documentation

## 2011-10-04 DIAGNOSIS — R109 Unspecified abdominal pain: Secondary | ICD-10-CM

## 2011-10-04 DIAGNOSIS — R1032 Left lower quadrant pain: Secondary | ICD-10-CM | POA: Insufficient documentation

## 2011-10-04 LAB — URINALYSIS, ROUTINE W REFLEX MICROSCOPIC
Hgb urine dipstick: NEGATIVE
Nitrite: NEGATIVE
Protein, ur: NEGATIVE mg/dL
Specific Gravity, Urine: 1.012 (ref 1.005–1.030)
Urobilinogen, UA: 0.2 mg/dL (ref 0.0–1.0)

## 2011-10-04 LAB — POCT PREGNANCY, URINE: Preg Test, Ur: NEGATIVE

## 2011-10-04 NOTE — ED Provider Notes (Signed)
History     CSN: [id number]  Arrival date & time 10/04/11  2114   First MD Initiated Contact with Patient 10/04/11 2337      Chief Complaint  Patient presents with  . Abdominal Pain    (Consider location/radiation/quality/duration/timing/severity/associated sxs/prior treatment) HPI  This patient is a generally healthy 26 year old gravida 1 para 1 female who presents with complaints of left lower abdominal pain for about the last 3-4 weeks. The patient states that she was seen at a local [facility] 4 weeks ago for her symptoms. She says that she was diagnosed with an infection in her ovary but did not undergo any ultrasound exam. She says that she was started on an antibiotic and took that antibiotic 3 times per day for 3 weeks. She cannot recall the name of the antibiotic. She says her symptoms felt improved but returned a few days after she finished her antibiotic.  The patient notes pain with both urination and defecation. She has not had any unusual vaginal discharge. She denies fever as well as nausea, vomiting and diarrhea. She denies history of pelvic inflammatory disease.    History reviewed. No pertinent past medical history.  Past Surgical History  Procedure Date  . Cesarean section   . Hysteroscopy w/d&c 01/24/2011    Procedure: DILATATION AND CURETTAGE /HYSTEROSCOPY;  Surgeon: Tereso Newcomer, MD;  Location: [facility];  Service: Gynecology;  Laterality: N/A;  Hysteroscopy only  . Iud removal 01/24/2011    Procedure: INTRAUTERINE DEVICE (IUD) REMOVAL;  Surgeon: Tereso Newcomer, MD;  Location: [facility];  Service: Gynecology;  Laterality: N/A;    No family history on file.  History  Substance Use Topics  . Smoking status: Never Smoker   . Smokeless tobacco: Never Used  . Alcohol Use: No    OB History    Grav Para Term Preterm Abortions TAB SAB Ect Mult Living   1 1 1       1       Review of Systems  Gen: no weight loss, fevers, chills, night  sweats Eyes: no discharge or drainage, no occular pain or visual changes Nose: no epistaxis or rhinorrhea Mouth: no dental pain, no sore throat Neck: no neck pain Lungs: no SOB, cough, wheezing CV: no chest pain, palpitations, dependent edema or orthopnea Abd: as noted above nausea, vomiting GU: as noted above no dysuria or gross hematuria MSK: no myalgias or arthralgias Neuro: no headache, no focal neurologic deficits Skin: no rash Psyche: negative..   Allergies  Review of patient's allergies indicates no known allergies.  Home Medications   Current Outpatient Rx  Name Route Sig Dispense Refill  . ACETAMINOPHEN 500 MG PO TABS Oral Take 500 mg by mouth every 6 (six) hours as needed. For pain      BP 104/64  Pulse 77  Temp 98.2 F (36.8 C) (Oral)  Resp 20  SpO2 99%  Physical Exam  Gen: well developed and well nourished appearing Head: NCAT Eyes: PERL, EOMI Nose: no epistaixis or rhinorrhea Mouth/throat: mucosa is moist and pink Neck: supple, no stridor Lungs: CTA B, no wheezing, rhonchi or rales CV: RRR, no murmur, ext well perfused.  Abd: soft, exquisitely tender to palpation over the LLQ, no RLQ ttp, mild LUQ ttp, nondistended Back: no ttp, no cva ttp Skin: no rashese, wnl Neuro: CN ii-xii grossly intact, no focal deficits Psyche; normal affect,  calm and cooperative.   Pelvic exam performed at approx 0315:   Normal female  external genitalia and vulva, some white discharge present in the vagina, no purulent discharge appreciated, cervix appears grossly normal. Patient complains of pain in the LLQ on bimanual exam and I am unable to exclude CMT based on this. The patient has pain in the LLQ with movement of the cervix on bimanual exam. No palpable masses.   ED Course  Procedures (including critical care time)  Patient treated symptomatically with percocet. CBC, CMP, U/A and u preg performed. Pelvic U/S performed by Radiology and interpreted by radiologist.     MDM  DDX: ovarian torsion, TOA, ectopic pregnancy, PID, cervicitis, UTI, colitis, enteritis, IBS, IBD, diverticulitis is much less likely given the patient's age.   ED work up is non-diagnostic with normal CBC, CMP, U/A and negative urine preg. TV and TA pelvic U/S normal. Bimanual exam notable for signs of possible cervicitis. GC and chlamydia cultures pending. We will tx empirically with Ceftriaxone and Azithromycin. Patient informed of results of study. Plan is for symptomatic management and outpatient f/u. Patient and husband counseled that she should return for worsening sx, fever or any other urgent health concerns.         Brandt Loosen, MD 10/05/11 [phone]

## 2011-10-04 NOTE — ED Notes (Signed)
Patient with lower abdominal pain that radiates to her back for about two weeks.  Patient also c/o burning sensation when she urinates.  Patient also c/o her butt hurting.

## 2011-10-05 ENCOUNTER — Emergency Department (HOSPITAL_COMMUNITY): Payer: Self-pay

## 2011-10-05 LAB — CBC WITH DIFFERENTIAL/PLATELET
Basophils Absolute: 0 10*3/uL (ref 0.0–0.1)
Basophils Relative: 0 % (ref 0–1)
Eosinophils Relative: 1 % (ref 0–5)
HCT: 35 % — ABNORMAL LOW (ref 36.0–46.0)
Lymphocytes Relative: 45 % (ref 12–46)
MCHC: 35.7 g/dL (ref 30.0–36.0)
MCV: 87.5 fL (ref 78.0–100.0)
Monocytes Absolute: 0.7 10*3/uL (ref 0.1–1.0)
Neutro Abs: 3.2 10*3/uL (ref 1.7–7.7)
Platelets: 246 10*3/uL (ref 150–400)
RDW: 12.8 % (ref 11.5–15.5)
WBC: 7.1 10*3/uL (ref 4.0–10.5)

## 2011-10-05 LAB — COMPREHENSIVE METABOLIC PANEL
ALT: 43 U/L — ABNORMAL HIGH (ref 0–35)
AST: 30 U/L (ref 0–37)
Albumin: 4 g/dL (ref 3.5–5.2)
Calcium: 9.9 mg/dL (ref 8.4–10.5)
Creatinine, Ser: 0.69 mg/dL (ref 0.50–1.10)
Sodium: 137 mEq/L (ref 135–145)
Total Protein: 7.3 g/dL (ref 6.0–8.3)

## 2011-10-05 LAB — WET PREP, GENITAL
Clue Cells Wet Prep HPF POC: NONE SEEN
Trich, Wet Prep: NONE SEEN
WBC, Wet Prep HPF POC: NONE SEEN

## 2011-10-05 MED ORDER — CEFTRIAXONE SODIUM 250 MG IJ SOLR
250.0000 mg | Freq: Once | INTRAMUSCULAR | Status: AC
  Administered 2011-10-05: 250 mg via INTRAMUSCULAR
  Filled 2011-10-05: qty 250

## 2011-10-05 MED ORDER — TRAMADOL HCL 50 MG PO TABS: 50.0000 mg | ORAL_TABLET | Freq: Four times a day (QID) | ORAL | Status: DC | PRN

## 2011-10-05 MED ORDER — OXYCODONE-ACETAMINOPHEN 5-325 MG PO TABS
2.0000 | ORAL_TABLET | Freq: Once | ORAL | Status: AC
  Administered 2011-10-05: 2 via ORAL
  Filled 2011-10-05: qty 1

## 2011-10-05 MED ORDER — LIDOCAINE HCL (PF) 1 % IJ SOLN
INTRAMUSCULAR | Status: AC
  Administered 2011-10-05: 2.5 mL
  Filled 2011-10-05: qty 5

## 2011-10-05 MED ORDER — MORPHINE SULFATE 4 MG/ML IJ SOLN: 4.0000 mg | Freq: Once | INTRAMUSCULAR | Status: DC

## 2011-10-05 MED ORDER — AZITHROMYCIN 250 MG PO TABS
1000.0000 mg | ORAL_TABLET | Freq: Once | ORAL | Status: AC
  Administered 2011-10-05: 1000 mg via ORAL
  Filled 2011-10-05: qty 4

## 2011-10-05 NOTE — ED Notes (Signed)
Pt to ultrasound at this time.

## 2011-10-05 NOTE — ED Notes (Signed)
Pt returned to room from ultrasound, no distress noted. 

## 2011-10-06 LAB — GC/CHLAMYDIA PROBE AMP, GENITAL
Chlamydia, DNA Probe: NEGATIVE
GC Probe Amp, Genital: NEGATIVE

## 2012-01-16 NOTE — L&D Delivery Note (Signed)
Delivery Note At 2:26 AM a healthy female was delivered via Vaginal, Spontaneous Delivery (Presentation:LOA).  APGAR: 2, pending ; weight pending.   Placenta status: Intact, Spontaneous.  Cord: 3 vessel with the following complications: None.  Cord pH: pending  Delivered stunned infant who was moved to the warmer and resuscitation was begun. NICU was called and continued resuscitation, baby responded well and is now vigorous.   Anesthesia: Epidural  Episiotomy: None Lacerations: 1st degree perineal Suture Repair: N/A Est. Blood Loss (mL): 300 mL  Mom to postpartum.  Baby to Nursery.  Kevin Fenton 12/02/2012, 2:43 AM  I also attended delivery Infant had eyes open and HR of 70 initially, I bagged him until [facility] team arrived He began to cry and was left with parents in room Agree with note Aviva Signs, CNM

## 2012-05-05 ENCOUNTER — Encounter: Payer: Self-pay | Admitting: *Deleted

## 2012-06-11 ENCOUNTER — Other Ambulatory Visit: Payer: Self-pay

## 2012-06-11 DIAGNOSIS — Z331 Pregnant state, incidental: Secondary | ICD-10-CM

## 2012-06-11 NOTE — Progress Notes (Signed)
OB LABS DONE TODAY Megan Buchanan 

## 2012-06-12 LAB — OBSTETRIC PANEL
Antibody Screen: NEGATIVE
Basophils Absolute: 0 10*3/uL (ref 0.0–0.1)
Basophils Relative: 0 % (ref 0–1)
Eosinophils Absolute: 0 10*3/uL (ref 0.0–0.7)
Eosinophils Relative: 0 % (ref 0–5)
HCT: 31.9 % — ABNORMAL LOW (ref 36.0–46.0)
MCH: 31.9 pg (ref 26.0–34.0)
MCHC: 36.1 g/dL — ABNORMAL HIGH (ref 30.0–36.0)
Monocytes Absolute: 0.4 10*3/uL (ref 0.1–1.0)
Monocytes Relative: 6 % (ref 3–12)
Neutro Abs: 5.6 10*3/uL (ref 1.7–7.7)
RDW: 14.5 % (ref 11.5–15.5)
Rh Type: POSITIVE

## 2012-06-12 LAB — SICKLE CELL SCREEN: Sickle Cell Screen: NEGATIVE

## 2012-06-12 LAB — HIV ANTIBODY (ROUTINE TESTING W REFLEX): HIV: NONREACTIVE

## 2012-06-14 LAB — CULTURE, OB URINE: Colony Count: 30000

## 2012-06-18 ENCOUNTER — Telehealth: Payer: Self-pay | Admitting: Family Medicine

## 2012-06-18 ENCOUNTER — Encounter: Payer: Self-pay | Admitting: Family Medicine

## 2012-06-18 ENCOUNTER — Ambulatory Visit (INDEPENDENT_AMBULATORY_CARE_PROVIDER_SITE_OTHER): Payer: Self-pay | Admitting: Family Medicine

## 2012-06-18 VITALS — BP 106/69 | Temp 98.9°F | Wt 143.0 lb

## 2012-06-18 DIAGNOSIS — Z3480 Encounter for supervision of other normal pregnancy, unspecified trimester: Secondary | ICD-10-CM | POA: Insufficient documentation

## 2012-06-18 DIAGNOSIS — O2342 Unspecified infection of urinary tract in pregnancy, second trimester: Secondary | ICD-10-CM

## 2012-06-18 DIAGNOSIS — O2390 Unspecified genitourinary tract infection in pregnancy, unspecified trimester: Secondary | ICD-10-CM

## 2012-06-18 DIAGNOSIS — Z3482 Encounter for supervision of other normal pregnancy, second trimester: Secondary | ICD-10-CM

## 2012-06-18 DIAGNOSIS — O2340 Unspecified infection of urinary tract in pregnancy, unspecified trimester: Secondary | ICD-10-CM | POA: Insufficient documentation

## 2012-06-18 DIAGNOSIS — R300 Dysuria: Secondary | ICD-10-CM

## 2012-06-18 DIAGNOSIS — Z124 Encounter for screening for malignant neoplasm of cervix: Secondary | ICD-10-CM

## 2012-06-18 LAB — POCT URINALYSIS DIPSTICK
Blood, UA: NEGATIVE
Ketones, UA: NEGATIVE
Protein, UA: NEGATIVE
Spec Grav, UA: 1.015
Urobilinogen, UA: NEGATIVE
pH, UA: 6.5

## 2012-06-18 LAB — POCT UA - MICROSCOPIC ONLY

## 2012-06-18 MED ORDER — CEPHALEXIN 500 MG PO CAPS: 500.0000 mg | ORAL_CAPSULE | Freq: Three times a day (TID) | ORAL | Status: DC

## 2012-06-18 MED ORDER — PRENATAL VIT W/ FE BISG-FA 25-1 MG PO TABS: 1.0000 | ORAL_TABLET | Freq: Every day | ORAL | Status: DC

## 2012-06-18 NOTE — Progress Notes (Signed)
Megan Buchanan is a 27 y.o. yo G2P1001 at Unknown who presents for her initial prenatal visit.  She reports mild dyspnea with drinking sweet things, dysuria, L groin pain with putting on her underwear She  isTaking PNV. See flow sheet for details.  PMH, POBH, FH, meds, allergies and Social Hx reviewed.  Prenatal exam:Gen: Well nourished, well developed.  No distress.  Vitals noted. HEENT: Normocephalic, atraumatic.  Neck supple without cervical lymphadenopathy.  fair dentition. CV: RRR no murmur, gallops or rubs Lungs: CTA B.  Normal respiratory effort without wheezes or rales. Abd: soft, NTND. +BS.  Uterus not appreciated above pelvis. GU: Normal external female genitalia without lesions.  Nl vaginal, well rugated without lesions. No vaginal discharge.  Bimanual exam: No adnexal mass or TTP. No CMT.  Uterus palpable just below umbilicus Ext: No clubbing, cyanosis or edema. Psych: Normal grooming and dress.  Not depressed or anxious appearing.  Normal thought content and process without flight of ideas or looseness of associations PHQ -9 score is 5- 2 for decreased sleep, 1 each fro decreased energy, anhedonia, and excessive/decreased appetite   Assessment/plan: 1) Pregnancy doing well.  Current pregnancy issues include Previous CS, desires VBAC- plan faculty practice consult at 30-32 weeks for consent, UTI currently Dating is reliable Prenatal labs reviewed, notable for no abnormal findings. Bleeding and pain precautions reviewed. Importance of prenatal vitamins reviewed.  Genetic screening offered.  Early glucola is not indicated. UTI- urine sent for culture, Keflex 500 TID for 7 days given    Follow up 4 weeks.

## 2012-06-18 NOTE — Assessment & Plan Note (Addendum)
With dysuria and 2+ LE on her UA Previous culture with 30k E. Coli Send for culture Keflex 500 TID X 7 days

## 2012-06-18 NOTE — Telephone Encounter (Signed)
Forgot to collect quad screen on initial OB. As she is spanish speaking only I will ask [employer] to call and let her know to come in before 20 weeks to have it collected.

## 2012-06-18 NOTE — Addendum Note (Signed)
Addended by: [place], Lowery Paullin on: 06/18/2012 04:32 PM   Modules accepted: Orders

## 2012-06-18 NOTE — Patient Instructions (Signed)
Follow up in 4 weeks Your Ultrasound is next Friday at 10am  Embarazo - [place] trimestre (Pregnancy - Second Trimester) El segundo trimestre del Psychiatrist (del 3 al 6 mes) es un perodo de evolucin rpida para usted y el beb. Hacia el final del sexto mes, el beb mide aproximadamente 23 cm y pesa 680 g. Comenzar a Pharmacologist del beb [place] 18 y las 20 [address] de [place]. Podr sentir las pataditas ("quickening en ingls"). Hay un rpido [employer]. Puede segregar un lquido claro Charity fundraiser) de las [place]. Quizs sienta pequeas contracciones en el vientre (tero) Esto se conoce como falso trabajo de parto o contracciones de Braxton-Hicks. Es como una prctica del trabajo de parto que se produce cuando el beb est listo para salir. Generalmente los problemas de vmitos matinales ya se han superado hacia el final del Medical sales representative. Algunas mujeres desarrollan pequeas manchas oscuras (que se denominan cloasma, mscara del embarazo) en la cara que normalmente se van luego del nacimiento del beb. La exposicin al sol empeora las manchas. Puede desarrollarse acn en algunas mujeres embarazadas, y puede desaparecer en aquellas que ya tienen acn. EXAMENES PRENATALES  Durante los [employer], deber seguir realizando pruebas de [place], segn avance el [place]. Estas pruebas se realizan para controlar su salud y la del beb. Tambin se realizan anlisis de sangre para [employer] niveles de [place]. La anemia (bajo nivel de hemoglobina) es frecuente durante el embarazo. Para prevenirla, se administran hierro y vitaminas. Tambin se le realizarn exmenes para saber si tiene diabetes entre las 24 y las 28 semanas del [place]. Podrn repetirle algunas de las [employer] hicieron previamente.  En cada visita le medirn el tamao del tero. Esto se realiza para asegurarse de que el beb est creciendo correctamente de acuerdo al estado del [place].  Tambin en cada  visita prenatal controlarn su presin arterial. Esto se realiza para asegurarse de que no tenga toxemia.  Se controlar su orina para asegurarse de que no tenga infecciones, diabetes o protena en la orina.  Se controlar su peso regularmente para asegurarse que el aumento ocurre al ritmo indicado. Esto se hace para asegurarse que usted y el beb tienen una evolucin normal.  En algunas ocasiones se realiza una prueba de ultrasonido para confirmar el correcto desarrollo y evolucin del beb. Esta prueba se realiza con ondas sonoras inofensivas para el beb, de modo que el profesional pueda calcular ms precisamente la fecha del [place]. Algunas veces se realizan pruebas especializadas del lquido amnitico que rodea al beb. Esta prueba se denomina amniocentesis. El lquido amnitico se obtiene introduciendo una aguja en el vientre (abdomen). Se realiza para Conservator, museum/gallery en los que existe alguna preocupacin acerca de algn problema gentico que pueda sufrir el beb. En ocasiones se lleva a cabo cerca del final del embarazo, si es necesario inducir al [employer]. En este caso se realiza para asegurarse que los pulmones del beb estn lo suficientemente maduros como para que pueda vivir fuera del tero. CAMBIOS QUE OCURREN EN EL SEGUNDO TRIMESTRE DEL EMBARAZO Su organismo atravesar numerosos cambios durante el [employer]. Estos pueden variar de Neomia Dear persona a otra. [place] con el profesional que la asiste acerca los cambios que usted note y que la preocupen.  Durante el segundo trimestre probablemente sienta un aumento del apetito. Es normal tener "antojos" de Development worker, community. Esto vara de Neomia Dear persona a otra y de un embarazo a Therapist, art.  El abdomen inferior comenzar a  abultarse.  Podr tener la necesidad de Geographical information systems officer con ms frecuencia debido a que el tero y el beb presionan sobre la vejiga. Tambin es frecuente contraer ms infecciones urinarias durante el [employer]. Puede evitarlas  bebiendo gran cantidad de lquidos y vaciando la vejiga antes y despus de Sales promotion account executive.  Podrn aparecer las primeras estras en las caderas, abdomen y [place]. Estos son cambios normales del cuerpo durante el [place]. No existen medicamentos ni ejercicios que puedan prevenir [employer].  Es posible que comience a desarrollar venas inflamadas y abultadas (varices) en las piernas. El uso de medias de descanso, Optometrist sus pies durante 15 minutos, 3 a 4 veces al da y Film/video editor la sal en su dieta ayuda a Journalist, newspaper.  Podr sentir Engineering geologist gstrica a medida que el tero crece y Doctor, general practice. Puede tomar anticidos, con la autorizacin de su mdico, para Financial planner. Tambin es til ingerir pequeas comidas 4 a 5 veces al Futures trader.  La constipacin puede tratarse con un laxante o agregando fibra a su dieta. Beber grandes cantidades de lquidos, comer vegetales, frutas y granos integrales es de [place].  Tambin es beneficioso practicar actividad fsica. Si ha sido una persona Engineer, mining, podr continuar con la [employer] de las actividades durante el mismo. Si ha sido [employer], puede ser beneficioso que comience con un programa de ejercicios, Museum/gallery exhibitions officer.  Puede desarrollar hemorroides hacia el final del segundo trimestre. Tomar baos de asiento tibios y Chemical engineer cremas recomendadas por el profesional que lo asiste sern de ayuda para los problemas de hemorroides.  Tambin podr Financial risk analyst de espalda durante este momento de su embarazo. Evite levantar objetos pesados, utilice zapatos de taco bajo y [place] buena postura para ayudar a reducir los problemas de [place].  Algunas mujeres embarazadas desarrollan hormigueo y adormecimiento de la mano y los dedos debido a la hinchazn y compresin de los ligamentos de la mueca (sndrome del tnel carpiano). Esto desaparece una vez que el beb nace.  Como sus pechos se agrandan,  Pension scheme manager un sujetador ms grande. Use un sostn de soporte, cmodo y de algodn. No utilice un sostn para amamantar hasta el ltimo mes de embarazo si va a amamantar al beb.  Podr observar una lnea oscura desde el ombligo hacia la zona pbica denominada linea nigra.  Podr observar que sus mejillas se ponen coloradas debido al aumento de flujo sanguneo en la cara.  Podr desarrollar "araitas" en la cara, cuello y pecho. Esto desaparece una vez que el beb nace. INSTRUCCIONES PARA EL CUIDADO DOMICILIARIO  Es extremadamente importante que evite el cigarrillo, hierbas medicinales, alcohol y las drogas no prescriptas durante el Psychiatrist. Estas sustancias qumicas afectan la formacin y el desarrollo del beb. Evite estas sustancias durante todo el embarazo para asegurar el nacimiento de un beb sano.  La mayor parte de los cuidados que se aconsejan son los mismos que los indicados para Financial risk analyst trimestre del Psychiatrist. Cumpla con las citas tal como se le indic. Siga las instrucciones del profesional que lo asiste con respecto al uso de los medicamentos, el ejercicio y Psychologist, forensic.  Durante el embarazo debe obtener nutrientes para usted y para su beb. Consuma alimentos balanceados a intervalos regulares. Elija alimentos como carne, pescado, [place] y otros productos lcteos descremados, vegetales, frutas, panes integrales y cereales. El Equities trader cul es el aumento de peso ideal.  Las relaciones sexuales fsicas pueden continuarse hasta cerca del fin del  embarazo si no existen otros problemas. Estos problemas pueden ser la prdida temprana (prematura) de lquido amnitico de las [place], sangrado vaginal, dolor abdominal u otros problemas mdicos o del Psychiatrist.  Realice [employer], si no tiene restricciones. Consulte con el profesional que la asiste si no sabe con certeza si determinados ejercicios son seguros. El mayor aumento de peso tiene Environmental consultant durante los  ltimos 2 trimestres del Psychiatrist. El ejercicio la ayudar a:  Engineering geologist.  Ponerla en forma para el parto.  Ayudarla a perder peso luego de haber dado a luz.  Use un buen sostn o como los que se usan para hacer deportes para Paramedic la sensibilidad de las [place]. Tambin puede serle til si lo [place] mientras duerme. Si pierde Product manager, podr [employer].  No utilice la baera con agua caliente, baos turcos y saunas durante el [address]  Utilice el cinturn de seguridad sin excepcin cuando conduzca. Este la proteger a usted y al beb en caso de accidente.  Evite comer carne cruda, queso crudo, y el contacto con los utensilios y desperdicios de los gatos. Estos elementos contienen grmenes que pueden causar defectos de nacimiento en el beb.  El segundo trimestre es un buen momento para visitar a su dentista y Software engineer si an no lo ha hecho. Es Primary school teacher los dientes limpios. Utilice un cepillo de dientes blando. Cepllese ms suavemente durante el embarazo.  Es ms fcil perder algo de orina durante el [place]. Apretar y Chief Operating Officer los msculos de la pelvis la ayudar con este problema. Practique detener la miccin cuando est en el bao. Estos son los mismos msculos que Development worker, international aid. Son [employer] mismos msculos que utiliza cuando trata de [employer] gases. Puede practicar apretando estos msculos 10 veces, y repetir esto 3 veces por da aproximadamente. Una vez que conozca qu msculos debe apretar, no realice estos ejercicios durante la miccin. Puede favorecerle una infeccin si la orina vuelve hacia atrs.  Pida ayuda si tiene necesidades econmicas, de asesoramiento o nutricionales durante el [place]. El profesional podr ayudarla con respecto a estas necesidades, o derivarla a otros especialistas.  La piel puede ponerse grasa. Si esto sucede, lvese la cara con un jabn [place], utilice un humectante no graso y Abie con  base de aceite o crema. CONSUMO DE MEDICAMENTOS Y DROGAS DURANTE EL EMBARAZO  Contine tomando las vitaminas apropiadas para esta etapa tal como se le indic. Las vitaminas deben contener un miligramo de cido flico y deben suplementarse con hierro. Guarde todas las vitaminas fuera del alcance de los nios. La ingestin de slo un par de vitaminas o tabletas que contengan hierro puede ocasionar la [employer] en un beb o en un nio pequeo.  Evite el uso de [place], inclusive los de venta libre y hierbas que no hayan sido prescriptos o indicados por el profesional que la asiste. Algunos medicamentos pueden causar problemas fsicos al beb. Utilice los medicamentos de venta libre o de prescripcin para Chief Technology Officer, Environmental health practitioner o la [place], segn se lo indique el profesional que lo asiste. No utilice aspirina.  El consumo de alcohol est relacionado con ciertos defectos de nacimiento. Esto incluye el sndrome de alcoholismo fetal. Debe evitar el consumo de alcohol en cualquiera de sus formas. El cigarrillo causa nacimientos prematuros y bebs de Gates. El uso de drogas recreativas est absolutamente prohibido. Son muy nocivas para el beb. Un beb que nace de [employer], ser [employer]  al nacer. Ese beb tendr los mismos sntomas de abstinencia que un adulto.  Infrmele al profesional si consume alguna droga.  No consuma drogas ilegales. Pueden causarle mucho dao al beb. SOLICITE ATENCIN MDICA SI: Tiene preguntas o preocupaciones durante su embarazo. Es mejor que llame para Science writer las dudas que esperar hasta su prxima visita prenatal. Thressa Sheller forma se sentir ms tranquila.  SOLICITE ATENCIN MDICA DE INMEDIATO SI:  La temperatura oral se eleva sin motivo por encima de 102 F (38.9 C) o segn le indique el profesional que lo asiste.  Tiene una prdida de lquido por la vagina (canal de parto). Si sospecha una ruptura de las [place], tmese la temperatura y llame al profesional para  informarlo sobre esto.  Observa unas pequeas manchas, una hemorragia vaginal o elimina cogulos. Notifique al profesional acerca de la cantidad y de cuntos apsitos est utilizando. Unas pequeas manchas de sangre son algo comn durante el Psychiatrist, especialmente despus de Sales promotion account executive.  Presenta un olor desagradable en la secrecin vaginal y observa un cambio en el color, de transparente a blanco.  Contina con las nuseas y no obtiene alivio de los remedios indicados. Vomita sangre o algo similar a la borra del caf.  [place] o sube ms de 900 g. en una semana, o segn lo indicado por el profesional que la asiste.  Observa que se le [employer], las manos, los pies o las piernas.  Ha estado expuesta a la rubola y no ha sufrido la enfermedad.  Ha estado expuesta a la quinta enfermedad o a la varicela.  Presenta dolor abdominal. Las molestias en el ligamento redondo son Neomia Dear causa no cancerosa (benigna) frecuente de dolor abdominal durante el embarazo. El profesional que la asiste deber evaluarla.  Presenta dolor de cabeza intenso que no se [place].  Presenta fiebre, diarrea, dolor al orinar o le falta la respiracin.  Presenta dificultad para ver, visin borrosa, o visin doble.  Sufre una cada, un accidente de trnsito o cualquier tipo de trauma.  Vive en un hogar en el que existe violencia fsica o mental. Document Released: 10/11/2004 Document Revised: 09/26/2011 [facility] Patient Information 2014 [place], [place].

## 2012-06-19 NOTE — Telephone Encounter (Signed)
I called pt and LVM hopefully pt will call us back. [employer]

## 2012-06-20 ENCOUNTER — Telehealth: Payer: Self-pay

## 2012-06-20 NOTE — Telephone Encounter (Signed)
I called pt again al LVM. Hopefully pt will call us back to set up an appt for lab.  [employer]

## 2012-06-21 LAB — CULTURE, OB URINE

## 2012-07-01 ENCOUNTER — Telehealth: Payer: Self-pay | Admitting: *Deleted

## 2012-07-01 NOTE — Telephone Encounter (Signed)
Dr. Elsie Stain office([phone]) calling to see if Dr. Ermalinda Memos has received OB ultrasound results yet.  Katri states OB u/s will need to be repeated at 31 weeks due to a low lying placenta.  Will fwd message to Dr. Ermalinda Memos.  Carolin Quang, Darlyne Russian, CMA

## 2012-07-17 ENCOUNTER — Encounter: Payer: Self-pay | Admitting: Family Medicine

## 2012-07-17 ENCOUNTER — Ambulatory Visit (INDEPENDENT_AMBULATORY_CARE_PROVIDER_SITE_OTHER): Payer: Self-pay | Admitting: Family Medicine

## 2012-07-17 VITALS — BP 105/74 | Temp 98.2°F | Wt 139.5 lb

## 2012-07-17 DIAGNOSIS — Z3480 Encounter for supervision of other normal pregnancy, unspecified trimester: Secondary | ICD-10-CM

## 2012-07-17 DIAGNOSIS — R300 Dysuria: Secondary | ICD-10-CM

## 2012-07-17 DIAGNOSIS — Z3482 Encounter for supervision of other normal pregnancy, second trimester: Secondary | ICD-10-CM

## 2012-07-17 LAB — POCT URINALYSIS DIPSTICK
Ketones, UA: NEGATIVE
Protein, UA: NEGATIVE
Urobilinogen, UA: 0.2
pH, UA: 7

## 2012-07-17 LAB — POCT UA - MICROSCOPIC ONLY

## 2012-07-17 NOTE — Patient Instructions (Signed)
Please Follow up in 4 weeks  Embarazo - Segundo trimestre (Pregnancy - Second Trimester) El segundo trimestre del Psychiatrist (del 3 al 6 mes) es un perodo de evolucin rpida para usted y el beb. Hacia el final del sexto mes, el beb mide aproximadamente 23 cm y pesa 680 g. Comenzar a Pharmacologist del beb [place] 18 y las 20 [address] de [place]. Podr sentir las pataditas ("quickening en ingls"). Hay un rpido [employer]. Puede segregar un lquido claro Charity fundraiser) de las [place]. Quizs sienta pequeas contracciones en el vientre (tero) Esto se conoce como falso trabajo de parto o contracciones de Braxton-Hicks. Es como una prctica del trabajo de parto que se produce cuando el beb est listo para salir. Generalmente los problemas de vmitos matinales ya se han superado hacia el final del Medical sales representative. Algunas mujeres desarrollan pequeas manchas oscuras (que se denominan cloasma, mscara del embarazo) en la cara que normalmente se van luego del nacimiento del beb. La exposicin al sol empeora las manchas. Puede desarrollarse acn en algunas mujeres embarazadas, y puede desaparecer en aquellas que ya tienen acn. EXAMENES PRENATALES  Durante los [employer], deber seguir realizando pruebas de [place], segn avance el [place]. Estas pruebas se realizan para controlar su salud y la del beb. Tambin se realizan anlisis de sangre para [employer] niveles de [place]. La anemia (bajo nivel de hemoglobina) es frecuente durante el embarazo. Para prevenirla, se administran hierro y vitaminas. Tambin se le realizarn exmenes para saber si tiene diabetes entre las 24 y las 28 semanas del [place]. Podrn repetirle algunas de las [employer] hicieron previamente.  En cada visita le medirn el tamao del tero. Esto se realiza para asegurarse de que el beb est creciendo correctamente de acuerdo al estado del [place].  Tambin en cada visita prenatal controlarn su  presin arterial. Esto se realiza para asegurarse de que no tenga toxemia.  Se controlar su orina para asegurarse de que no tenga infecciones, diabetes o protena en la orina.  Se controlar su peso regularmente para asegurarse que el aumento ocurre al ritmo indicado. Esto se hace para asegurarse que usted y el beb tienen una evolucin normal.  En algunas ocasiones se realiza una prueba de ultrasonido para confirmar el correcto desarrollo y evolucin del beb. Esta prueba se realiza con ondas sonoras inofensivas para el beb, de modo que el profesional pueda calcular ms precisamente la fecha del [place]. Algunas veces se realizan pruebas especializadas del lquido amnitico que rodea al beb. Esta prueba se denomina amniocentesis. El lquido amnitico se obtiene introduciendo una aguja en el vientre (abdomen). Se realiza para Conservator, museum/gallery en los que existe alguna preocupacin acerca de algn problema gentico que pueda sufrir el beb. En ocasiones se lleva a cabo cerca del final del embarazo, si es necesario inducir al [employer]. En este caso se realiza para asegurarse que los pulmones del beb estn lo suficientemente maduros como para que pueda vivir fuera del tero. CAMBIOS QUE OCURREN EN EL SEGUNDO TRIMESTRE DEL EMBARAZO Su organismo atravesar numerosos cambios durante el [employer]. Estos pueden variar de Neomia Dear persona a otra. [place]verse con el profesional que la asiste acerca los cambios que usted note y que la preocupen.  Durante el segundo trimestre probablemente sienta un aumento del apetito. Es normal tener "antojos" de Development worker, community. Esto vara de Neomia Dear persona a otra y de un embarazo a Therapist, art.  El abdomen inferior comenzar a abultarse.  Podr tener la necesidad  de orinar con ms frecuencia debido a que el tero y el beb presionan sobre la vejiga. Tambin es frecuente contraer ms infecciones urinarias durante el [employer]. Puede evitarlas bebiendo gran cantidad de  lquidos y vaciando la vejiga antes y despus de Sales promotion account executive.  Podrn aparecer las primeras estras en las caderas, abdomen y [place]. Estos son cambios normales del cuerpo durante el [place]. No existen medicamentos ni ejercicios que puedan prevenir [employer].  Es posible que comience a desarrollar venas inflamadas y abultadas (varices) en las piernas. El uso de medias de descanso, Optometrist sus pies durante 15 minutos, 3 a 4 veces al da y Film/video editor la sal en su dieta ayuda a Journalist, newspaper.  Podr sentir Engineering geologist gstrica a medida que el tero crece y Doctor, general practice. Puede tomar anticidos, con la autorizacin de su mdico, para Financial planner. Tambin es til ingerir pequeas comidas 4 a 5 veces al Futures trader.  La constipacin puede tratarse con un laxante o agregando fibra a su dieta. Beber grandes cantidades de lquidos, comer vegetales, frutas y granos integrales es de [place].  Tambin es beneficioso practicar actividad fsica. Si ha sido una persona Engineer, mining, podr continuar con la [employer] de las actividades durante el mismo. Si ha sido [employer], puede ser beneficioso que comience con un programa de ejercicios, Museum/gallery exhibitions officer.  Puede desarrollar hemorroides hacia el final del segundo trimestre. Tomar baos de asiento tibios y Chemical engineer cremas recomendadas por el profesional que lo asiste sern de ayuda para los problemas de hemorroides.  Tambin podr Financial risk analyst de espalda durante este momento de su embarazo. Evite levantar objetos pesados, utilice zapatos de taco bajo y [place] buena postura para ayudar a reducir los problemas de [place].  Algunas mujeres embarazadas desarrollan hormigueo y adormecimiento de la mano y los dedos debido a la hinchazn y compresin de los ligamentos de la mueca (sndrome del tnel carpiano). Esto desaparece una vez que el beb nace.  Como sus pechos se agrandan, Pension scheme manager un sujetador ms  grande. Use un sostn de soporte, cmodo y de algodn. No utilice un sostn para amamantar hasta el ltimo mes de embarazo si va a amamantar al beb.  Podr observar una lnea oscura desde el ombligo hacia la zona pbica denominada linea nigra.  Podr observar que sus mejillas se ponen coloradas debido al aumento de flujo sanguneo en la cara.  Podr desarrollar "araitas" en la cara, cuello y pecho. Esto desaparece una vez que el beb nace. INSTRUCCIONES PARA EL CUIDADO DOMICILIARIO  Es extremadamente importante que evite el cigarrillo, hierbas medicinales, alcohol y las drogas no prescriptas durante el Psychiatrist. Estas sustancias qumicas afectan la formacin y el desarrollo del beb. Evite estas sustancias durante todo el embarazo para asegurar el nacimiento de un beb sano.  La mayor parte de los cuidados que se aconsejan son los mismos que los indicados para Financial risk analyst trimestre del Psychiatrist. Cumpla con las citas tal como se le indic. Siga las instrucciones del profesional que lo asiste con respecto al uso de los medicamentos, el ejercicio y Psychologist, forensic.  Durante el embarazo debe obtener nutrientes para usted y para su beb. Consuma alimentos balanceados a intervalos regulares. Elija alimentos como carne, pescado, [place] y otros productos lcteos descremados, vegetales, frutas, panes integrales y cereales. El Equities trader cul es el aumento de peso ideal.  Las relaciones sexuales fsicas pueden continuarse hasta cerca del fin del embarazo si no existen otros problemas.  Estos problemas pueden ser la prdida temprana (prematura) de lquido amnitico de las [place], sangrado vaginal, dolor abdominal u otros problemas mdicos o del Psychiatrist.  Realice [employer], si no tiene restricciones. Consulte con el profesional que la asiste si no sabe con certeza si determinados ejercicios son seguros. El mayor aumento de peso tiene Environmental consultant durante los ltimos 2 trimestres del  Psychiatrist. El ejercicio la ayudar a:  Engineering geologist.  Ponerla en forma para el parto.  Ayudarla a perder peso luego de haber dado a luz.  Use un buen sostn o como los que se usan para hacer deportes para Paramedic la sensibilidad de las [place]. Tambin puede serle til si lo [place] mientras duerme. Si pierde Product manager, podr [employer].  No utilice la baera con agua caliente, baos turcos y saunas durante el [address]  Utilice el cinturn de seguridad sin excepcin cuando conduzca. Este la proteger a usted y al beb en caso de accidente.  Evite comer carne cruda, queso crudo, y el contacto con los utensilios y desperdicios de los gatos. Estos elementos contienen grmenes que pueden causar defectos de nacimiento en el beb.  El segundo trimestre es un buen momento para visitar a su dentista y Software engineer si an no lo ha hecho. Es Primary school teacher los dientes limpios. Utilice un cepillo de dientes blando. Cepllese ms suavemente durante el embarazo.  Es ms fcil perder algo de orina durante el [place]. Apretar y Chief Operating Officer los msculos de la pelvis la ayudar con este problema. Practique detener la miccin cuando est en el bao. Estos son los mismos msculos que Development worker, international aid. Son [employer] mismos msculos que utiliza cuando trata de [employer] gases. Puede practicar apretando estos msculos 10 veces, y repetir esto 3 veces por da aproximadamente. Una vez que conozca qu msculos debe apretar, no realice estos ejercicios durante la miccin. Puede favorecerle una infeccin si la orina vuelve hacia atrs.  Pida ayuda si tiene necesidades econmicas, de asesoramiento o nutricionales durante el [place]. El profesional podr ayudarla con respecto a estas necesidades, o derivarla a otros especialistas.  La piel puede ponerse grasa. Si esto sucede, lvese la cara con un jabn [place], utilice un humectante no graso y [place] con base de aceite o  crema. CONSUMO DE MEDICAMENTOS Y DROGAS DURANTE EL EMBARAZO  Contine tomando las vitaminas apropiadas para esta etapa tal como se le indic. Las vitaminas deben contener un miligramo de cido flico y deben suplementarse con hierro. Guarde todas las vitaminas fuera del alcance de los nios. La ingestin de slo un par de vitaminas o tabletas que contengan hierro puede ocasionar la [employer] en un beb o en un nio pequeo.  Evite el uso de [place], inclusive los de venta libre y hierbas que no hayan sido prescriptos o indicados por el profesional que la asiste. Algunos medicamentos pueden causar problemas fsicos al beb. Utilice los medicamentos de venta libre o de prescripcin para Chief Technology Officer, Environmental health practitioner o la [place], segn se lo indique el profesional que lo asiste. No utilice aspirina.  El consumo de alcohol est relacionado con ciertos defectos de nacimiento. Esto incluye el sndrome de alcoholismo fetal. Debe evitar el consumo de alcohol en cualquiera de sus formas. El cigarrillo causa nacimientos prematuros y bebs de [place]. El uso de drogas recreativas est absolutamente prohibido. Son muy nocivas para el beb. Un beb que nace de [employer], ser adicto al nacer. Ese beb tendr los  mismos sntomas de abstinencia que un adulto.  Infrmele al profesional si consume alguna droga.  No consuma drogas ilegales. Pueden causarle mucho dao al beb. SOLICITE ATENCIN MDICA SI: Tiene preguntas o preocupaciones durante su embarazo. Es mejor que llame para Science writer las dudas que esperar hasta su prxima visita prenatal. Thressa Sheller forma se sentir ms tranquila.  SOLICITE ATENCIN MDICA DE INMEDIATO SI:  La temperatura oral se eleva sin motivo por encima de 102 F (38.9 C) o segn le indique el profesional que lo asiste.  Tiene una prdida de lquido por la vagina (canal de parto). Si sospecha una ruptura de las [place], tmese la temperatura y llame al profesional para informarlo sobre  esto.  Observa unas pequeas manchas, una hemorragia vaginal o elimina cogulos. Notifique al profesional acerca de la cantidad y de cuntos apsitos est utilizando. Unas pequeas manchas de sangre son algo comn durante el Psychiatrist, especialmente despus de Sales promotion account executive.  Presenta un olor desagradable en la secrecin vaginal y observa un cambio en el color, de transparente a blanco.  Contina con las nuseas y no obtiene alivio de los remedios indicados. Vomita sangre o algo similar a la borra del caf.  [place] o sube ms de 900 g. en una semana, o segn lo indicado por el profesional que la asiste.  Observa que se le [employer], las manos, los pies o las piernas.  Ha estado expuesta a la rubola y no ha sufrido la enfermedad.  Ha estado expuesta a la quinta enfermedad o a la varicela.  Presenta dolor abdominal. Las molestias en el ligamento redondo son Neomia Dear causa no cancerosa (benigna) frecuente de dolor abdominal durante el embarazo. El profesional que la asiste deber evaluarla.  Presenta dolor de cabeza intenso que no se [place].  Presenta fiebre, diarrea, dolor al orinar o le falta la respiracin.  Presenta dificultad para ver, visin borrosa, o visin doble.  Sufre una cada, un accidente de trnsito o cualquier tipo de trauma.  Vive en un hogar en el que existe violencia fsica o mental. Document Released: 10/11/2004 Document Revised: 09/26/2011 [facility] Patient Information 2014 [place], [place].

## 2012-07-17 NOTE — Progress Notes (Signed)
Megan Buchanan is a 27 y.o. G2P1001 at [redacted]w[redacted]d for routine follow up.  She reports intermittent dysuria but non e for the last few days.  See flow sheet for details.  A/P: Pregnancy at [redacted]w[redacted]d.  Doing well.  Pregnancy issues include low lying placenta and previous UTI now with intermittent dysuria.  Anatomy ultrasound ordered performed at 16 weeks, needs repeat at 31 weeks for low lying placenta UA today withy Trace LE, 3+ rods and 1-5 squams on micro. Given squams I question how clean of a catch it is. Will culture and defer treating this time given she had TID Keflex previously for 7 days.  Bleeding and pain precautions reviewed. Follow up 4 weeks.

## 2012-08-13 ENCOUNTER — Ambulatory Visit (INDEPENDENT_AMBULATORY_CARE_PROVIDER_SITE_OTHER): Payer: Self-pay | Admitting: Family Medicine

## 2012-08-13 VITALS — BP 94/60 | Wt 142.0 lb

## 2012-08-13 DIAGNOSIS — Z3482 Encounter for supervision of other normal pregnancy, second trimester: Secondary | ICD-10-CM

## 2012-08-13 DIAGNOSIS — Z3480 Encounter for supervision of other normal pregnancy, unspecified trimester: Secondary | ICD-10-CM

## 2012-08-13 NOTE — Patient Instructions (Signed)
Come back to see me in 4 weeks.   Evaluacin de los movimientos fetales  (Fetal Movement Counts) Nombre del paciente: __________________________________________________ Megan Buchanan estimada: ____________________ Caroleen Hamman de los movimientos fetales es muy recomendable en los embarazos de alto riesgo, pero tambin es una buena idea que lo hagan todas las [place]. El Firefighter que comience a contarlos a las 28 semanas de [place]. Los movimientos fetales suelen aumentar:   Despus de Animator.  Despus de la actividad fsica.  Despus de comer o beber [employer] o fro.  En reposo. Preste atencin cuando sienta que el beb est ms activo. Esto le ayudar a notar un patrn de ciclos de vigilia y sueo de su beb y cules son los factores que contribuyen a un aumento de los movimientos fetales. Es importante llevar a cabo un recuento de movimientos fetales, al mismo tiempo cada da, cuando el beb normalmente est ms activo.  CMO CONTAR LOS MOVIMIENTOS FETALES 1. Busque un lugar tranquilo y cmodo para sentarse o recostarse sobre el lado izquierdo. Al recostarse sobre su lado izquierdo, le proporciona una mejor circulacin de [place] y oxgeno al beb. 2. Anote el da y la hora en una hoja de papel o en un diario. 3. Comience contando las pataditas, revoloteos, chasquidos, vueltas o pinchazos en un perodo de 2 horas. Debe sentir al menos 10 movimientos en 2 horas. 4. Si no siente 10 movimientos en 2 horas, espere 2  3 horas y cuente de nuevo. Busque cambios en el patrn o si no cuenta lo suficiente en 2 horas. SOLICITE ATENCIN MDICA SI:   Siente menos de 10 pataditas en 2 horas, en dos intentos.  No hay movimientos durante una hora.  El patrn se modifica o le lleva ms tiempo Art gallery manager las 10 pataditas.

## 2012-08-13 NOTE — Progress Notes (Signed)
KORINA TRETTER is a 27 y.o. G2P1001 at [redacted]w[redacted]d for routine follow up.  She reports discomfort with sex, and hyperpigmentation of her face.   See flow sheet for details.  A/P: Pregnancy at [redacted]w[redacted]d.  Doing well.   Pregnancy issues include low lying placenta   Anatomy scan reviewed, problems are : low lying placenta.  Preterm labor precautions reviewed, kick counts reviewed Recommended avoiding sex with low-lying placenta until f/u US VBAC, will need appt at womens for consent around 30 weeks.  Follow up 4 weeks.

## 2012-08-26 ENCOUNTER — Inpatient Hospital Stay (HOSPITAL_COMMUNITY)
Admission: AD | Admit: 2012-08-26 | Discharge: 2012-08-26 | Disposition: A | Discharge status: Home / Self Care | Payer: Self-pay | Source: Ambulatory Visit | Attending: Obstetrics & Gynecology | Admitting: Obstetrics & Gynecology

## 2012-08-26 ENCOUNTER — Encounter (HOSPITAL_COMMUNITY): Payer: Self-pay | Admitting: *Deleted

## 2012-08-26 DIAGNOSIS — N760 Acute vaginitis: Secondary | ICD-10-CM | POA: Insufficient documentation

## 2012-08-26 DIAGNOSIS — O2342 Unspecified infection of urinary tract in pregnancy, second trimester: Secondary | ICD-10-CM

## 2012-08-26 DIAGNOSIS — M549 Dorsalgia, unspecified: Secondary | ICD-10-CM | POA: Insufficient documentation

## 2012-08-26 DIAGNOSIS — R109 Unspecified abdominal pain: Secondary | ICD-10-CM | POA: Insufficient documentation

## 2012-08-26 DIAGNOSIS — A499 Bacterial infection, unspecified: Secondary | ICD-10-CM

## 2012-08-26 DIAGNOSIS — N390 Urinary tract infection, site not specified: Secondary | ICD-10-CM | POA: Insufficient documentation

## 2012-08-26 DIAGNOSIS — B9689 Other specified bacterial agents as the cause of diseases classified elsewhere: Secondary | ICD-10-CM | POA: Insufficient documentation

## 2012-08-26 LAB — URINE MICROSCOPIC-ADD ON

## 2012-08-26 LAB — URINALYSIS, ROUTINE W REFLEX MICROSCOPIC
Glucose, UA: NEGATIVE mg/dL
Protein, ur: NEGATIVE mg/dL
pH: 7 (ref 5.0–8.0)

## 2012-08-26 LAB — WET PREP, GENITAL: Yeast Wet Prep HPF POC: NONE SEEN

## 2012-08-26 MED ORDER — CEPHALEXIN 500 MG PO CAPS: 500.0000 mg | ORAL_CAPSULE | Freq: Three times a day (TID) | ORAL | Status: DC

## 2012-08-26 MED ORDER — METRONIDAZOLE 500 MG PO TABS: 500.0000 mg | ORAL_TABLET | Freq: Two times a day (BID) | ORAL | Status: DC

## 2012-08-26 NOTE — MAU Provider Note (Signed)
History     CSN: [id number]  Arrival date and time: 08/26/12 1905   None     Chief Complaint  Patient presents with  . Abdominal Pain   HPI 27 y.o. G2P1001 at [redacted]w[redacted]d presents for back and abdominal pain. Back pain started yesterday, located on right side, describes as sharp and exacerbated by movement, breathing, and relieved when laying on left side. Area tender to touch. Tylenol has helped with this pain. Abdominal pain started last night, describes as constant but comes in waves and feels like contractions q30-52min. Also noticed yesterday burning with urination, had an episode of vomiting, and has been nauseas.  Denies gush of fluid, vaginal bleeding, vaginal discharge. +FM. Denies chest pain but does endorse shortness of breath primarily due to deep breathing causing pain in her back, denies fevers/chills, does have some diarrhea. Denies headache, dizziness, changes in vision.   Prenatal course: Care at [facility] - Low lying placenta on ultrasound  OB History   Grav Para Term Preterm Abortions TAB SAB Ect Mult Living   2 1 1       1     Previous C-section  Past Medical History  Diagnosis Date  . Shortness of breath   . Depression     Past Surgical History  Procedure Laterality Date  . Cesarean section    . Hysteroscopy w/d&c  01/24/2011    Procedure: DILATATION AND CURETTAGE /HYSTEROSCOPY;  Surgeon: Tereso Newcomer, MD;  Location: [facility];  Service: Gynecology;  Laterality: N/A;  Hysteroscopy only  . Iud removal  01/24/2011    Procedure: INTRAUTERINE DEVICE (IUD) REMOVAL;  Surgeon: Tereso Newcomer, MD;  Location: [facility];  Service: Gynecology;  Laterality: N/A;  . No past surgeries      Family History  Problem Relation Age of Onset  . Arthritis Mother   . Hyperlipidemia Father     History  Substance Use Topics  . Smoking status: Never Smoker   . Smokeless tobacco: Never Used  . Alcohol Use: No    Allergies: No Known Allergies  Prescriptions prior to admission   Medication Sig Dispense Refill  . acetaminophen (TYLENOL) 325 MG tablet Take 650 mg by mouth every 6 (six) hours as needed for pain (back and stomach pain).      . Prenatal Vit w/ Fe Bisg-FA 25-1 MG TABS Take 1 tablet by mouth daily.  30 each  6  . [DISCONTINUED] acetaminophen (TYLENOL) 500 MG tablet Take 500 mg by mouth every 6 (six) hours as needed. For pain      . [DISCONTINUED] cephALEXin (KEFLEX) 500 MG capsule Take 1 capsule (500 mg total) by mouth 3 (three) times daily.  21 capsule  0  . [DISCONTINUED] traMADol (ULTRAM) 50 MG tablet Take 1 tablet (50 mg total) by mouth every 6 (six) hours as needed for pain.  30 tablet  0    ROS negative except as above Physical Exam   Blood pressure 111/75, pulse 95, temperature 98.2 F (36.8 C), temperature source Oral, resp. rate 16, height 4\' 11"  (1.499 m), weight 64.411 kg (142 lb), last menstrual period 03/09/2012, SpO2 100.00%.  Physical Exam General appearance: alert, cooperative and no distress Head: Normocephalic, without obvious abnormality, atraumatic Back: mild right flank tenderness to palpation, lateral below ribs Lungs: clear to auscultation bilaterally Heart: regular rate and rhythm, S1, S2 normal, no murmur, click, rub or gallop Abdomen: gravid, soft, mild tenderness in LLQ and RLQ Extremities: no edema, redness or tenderness in the calves or  thighs Pulses: 2+ and symmetric DP and PT Skin: warm and dry  SSE: NAVM, no blood present, white/green mucous present, cervix appears visually closed  Exam performed with nurse present.  FHT: 145bpm, mod var, accels present, no decels Toco: mild irritability, rare possible mild contractions MAU Course  Procedures Results for orders placed during the hospital encounter of 08/26/12 (from the past 24 hour(s))  URINALYSIS, ROUTINE W REFLEX MICROSCOPIC     Status: Abnormal   Collection Time    08/26/12  7:30 PM      Result Value Range   Color, Urine YELLOW  YELLOW   APPearance CLEAR   CLEAR   Specific Gravity, Urine <1.005 (*) 1.005 - 1.030   pH 7.0  5.0 - 8.0   Glucose, UA NEGATIVE  NEGATIVE mg/dL   Hgb urine dipstick TRACE (*) NEGATIVE   Bilirubin Urine NEGATIVE  NEGATIVE   Ketones, ur NEGATIVE  NEGATIVE mg/dL   Protein, ur NEGATIVE  NEGATIVE mg/dL   Urobilinogen, UA 0.2  0.0 - 1.0 mg/dL   Nitrite NEGATIVE  NEGATIVE   Leukocytes, UA LARGE (*) NEGATIVE  URINE MICROSCOPIC-ADD ON     Status: Abnormal   Collection Time    08/26/12  7:30 PM      Result Value Range   Squamous Epithelial / LPF FEW (*) RARE   WBC, UA 11-20  <3 WBC/hpf   RBC / HPF 0-2  <3 RBC/hpf   Bacteria, UA FEW (*) RARE  WET PREP, GENITAL     Status: Abnormal   Collection Time    08/26/12  9:05 PM      Result Value Range   Yeast Wet Prep HPF POC NONE SEEN  NONE SEEN   Trich, Wet Prep NONE SEEN  NONE SEEN   Clue Cells Wet Prep HPF POC MODERATE (*) NONE SEEN   WBC, Wet Prep HPF POC FEW (*) NONE SEEN   MDM   Assessment and Plan  27 y.o. G2P1001 at [redacted]w[redacted]d   Back pain sounds musculoskeletal, but with urinary symptoms and UA may be early pyelo, no fevers currently. UA with moderate leuks Will treat for presumptive UTI, keflex 500mg  TID x 7 days Wet prep with moderate clue cells Treat for BV with flagyl 500mg  BID x 7 days Can continue to take Tylenol 650mg  q6hours as needed Patient educated to come back to MAU if back pain continues or worsens to be re-evaluated Patient agrees with plan Stable for discharge  Tawni Carnes 08/26/2012, 8:39 PM   I have seen and examined this patient and I agree with the above. If no improvement in 24 hrs, instructed pt to call [facility] or come to MAU to be seen. Woodley Petzold 11:04 PM 08/26/2012

## 2012-08-26 NOTE — MAU Note (Signed)
Pt states she has pain with urination. 

## 2012-08-31 NOTE — MAU Provider Note (Signed)
Attestation of Attending Supervision of Advanced Practitioner (CNM/NP): Evaluation and management procedures were performed by the Advanced Practitioner under my supervision and collaboration. I have reviewed the Advanced Practitioner's note and chart, and I agree with the management and plan.  Azaylah Stailey H. 3:09 PM

## 2012-09-10 ENCOUNTER — Ambulatory Visit (INDEPENDENT_AMBULATORY_CARE_PROVIDER_SITE_OTHER): Payer: Self-pay | Admitting: Family Medicine

## 2012-09-10 VITALS — BP 98/64 | Temp 98.1°F | Wt 146.0 lb

## 2012-09-10 DIAGNOSIS — R300 Dysuria: Secondary | ICD-10-CM

## 2012-09-10 DIAGNOSIS — Z3480 Encounter for supervision of other normal pregnancy, unspecified trimester: Secondary | ICD-10-CM

## 2012-09-10 DIAGNOSIS — Z3482 Encounter for supervision of other normal pregnancy, second trimester: Secondary | ICD-10-CM

## 2012-09-10 LAB — POCT URINALYSIS DIPSTICK
Bilirubin, UA: NEGATIVE
Glucose, UA: NEGATIVE
Ketones, UA: NEGATIVE
Spec Grav, UA: 1.015

## 2012-09-10 LAB — POCT UA - MICROSCOPIC ONLY

## 2012-09-10 NOTE — Progress Notes (Signed)
Megan Buchanan is a 27 y.o. G2P1001 at [redacted]w[redacted]d for routine follow up.  She reports recent UTI, continued dysuria, some fevers that have resolved, and low back pain  See flow sheet for details.  A/P: Pregnancy at [redacted]w[redacted]d.  Doing well, concern for UTI Pregnancy issues include low lying placenta   Preterm labor precautions reviewed. Follow up 2 weeks for OGTT UA and Urine Cx for concern of UTI, drinking lots of water so urine may not show overt signs

## 2012-09-10 NOTE — Addendum Note (Signed)
Addended by: [place], Chandon Lazcano on: 09/10/2012 05:34 PM   Modules accepted: Orders

## 2012-09-10 NOTE — Patient Instructions (Signed)
Come back to follow up in 2 weeks  Evaluacin de los movimientos fetales  (Fetal Movement Counts) Nombre del paciente: __________________________________________________ Megan Buchanan estimada: ____________________ Caroleen Hamman de los movimientos fetales es muy recomendable en los embarazos de alto riesgo, pero tambin es una buena idea que lo hagan todas las [place]. El Firefighter que comience a contarlos a las 28 semanas de [place]. Los movimientos fetales suelen aumentar:   Despus de Animator.  Despus de la actividad fsica.  Despus de comer o beber [employer] o fro.  En reposo. Preste atencin cuando sienta que el beb est ms activo. Esto le ayudar a notar un patrn de ciclos de vigilia y sueo de su beb y cules son los factores que contribuyen a un aumento de los movimientos fetales. Es importante llevar a cabo un recuento de movimientos fetales, al mismo tiempo cada da, cuando el beb normalmente est ms activo.  CMO CONTAR LOS MOVIMIENTOS FETALES 1. Busque un lugar tranquilo y cmodo para sentarse o recostarse sobre el lado izquierdo. Al recostarse sobre su lado izquierdo, le proporciona una mejor circulacin de [place] y oxgeno al beb. 2. Anote el da y la hora en una hoja de papel o en un diario. 3. Comience contando las pataditas, revoloteos, chasquidos, vueltas o pinchazos en un perodo de 2 horas. Debe sentir al menos 10 movimientos en 2 horas. 4. Si no siente 10 movimientos en 2 horas, espere 2  3 horas y cuente de nuevo. Busque cambios en el patrn o si no cuenta lo suficiente en 2 horas. SOLICITE ATENCIN MDICA SI:   Siente menos de 10 pataditas en 2 horas, en dos intentos.  No hay movimientos durante una hora.  El patrn se modifica o le lleva ms tiempo Art gallery manager las 10 pataditas.

## 2012-09-11 ENCOUNTER — Telehealth: Payer: Self-pay | Admitting: Family Medicine

## 2012-09-11 DIAGNOSIS — O2342 Unspecified infection of urinary tract in pregnancy, second trimester: Secondary | ICD-10-CM

## 2012-09-11 LAB — CULTURE, OB URINE
Colony Count: NO GROWTH
Organism ID, Bacteria: NO GROWTH

## 2012-09-11 MED ORDER — FOSFOMYCIN TROMETHAMINE 3 G PO PACK: 3.0000 g | PACK | Freq: Once | ORAL | Status: DC

## 2012-09-11 NOTE — Telephone Encounter (Signed)
Pt with dysuria and small LE on Ua. Has had 2 previous UTIs treated with Keflex so will treat with fosfomycin 3 G X1 this time. Also sent for culture so will follow for sensitivities.   Called and informed, Rx sent  Murtis Sink, MD [facility] Resident, PGY-2 09/11/2012, 9:40 AM

## 2012-09-24 ENCOUNTER — Ambulatory Visit (INDEPENDENT_AMBULATORY_CARE_PROVIDER_SITE_OTHER): Payer: Self-pay | Admitting: Family Medicine

## 2012-09-24 VITALS — BP 102/66 | Wt 145.0 lb

## 2012-09-24 DIAGNOSIS — Z331 Pregnant state, incidental: Secondary | ICD-10-CM

## 2012-09-24 DIAGNOSIS — O2343 Unspecified infection of urinary tract in pregnancy, third trimester: Secondary | ICD-10-CM

## 2012-09-24 DIAGNOSIS — R300 Dysuria: Secondary | ICD-10-CM

## 2012-09-24 DIAGNOSIS — O2390 Unspecified genitourinary tract infection in pregnancy, unspecified trimester: Secondary | ICD-10-CM

## 2012-09-24 LAB — POCT UA - MICROSCOPIC ONLY

## 2012-09-24 LAB — CBC
Hemoglobin: 11 g/dL — ABNORMAL LOW (ref 12.0–15.0)
MCH: 31.5 pg (ref 26.0–34.0)
MCHC: 35 g/dL (ref 30.0–36.0)
Platelets: 251 10*3/uL (ref 150–400)
RDW: 13.8 % (ref 11.5–15.5)

## 2012-09-24 LAB — POCT URINALYSIS DIPSTICK
Blood, UA: NEGATIVE
Glucose, UA: NEGATIVE
Spec Grav, UA: 1.01
Urobilinogen, UA: 0.2
pH, UA: 7

## 2012-09-24 NOTE — Addendum Note (Signed)
Addended by: [place], Simrat Kendrick on: 09/24/2012 05:27 PM   Modules accepted: Orders

## 2012-09-24 NOTE — Patient Instructions (Signed)
It was great o see you today!  Follow up in 2 weeks in the [facility] clinic  Embarazo  Tercer trimestre  (Pregnancy - Third Trimester) El tercer trimestre del embarazo (los ltimos 3 meses) es el perodo en el cual tanto usted como su beb crecen con ms rapidez. El beb alcanza un largo de aproximadamente 50 cm. y pesa entre 2,700 y 4,500 kg. El beb gana ms tejido graso y est listo para la vida fuera del cuerpo de la [place]. Mientras estn en el interior, los bebs tienen perodos de sueo y vigilia, Warehouse manager y tienen hipo. Quizs sienta pequeas contracciones del tero. Este es el falso trabajo de [place]. Tambin se las conoce como contracciones de [place] . Es como una prctica del parto. Los problemas ms habituales de esta etapa del embarazo incluyen mayor dificultad para respirar, hinchazn de las manos y los pies por retencin de lquidos y la necesidad de Geographical information systems officer con ms frecuencia debido a que el tero y el beb presionan sobre la vejiga.  EXAMENES PRENATALES   Durante los Manpower Inc, deber seguir realizndose anlisis de [place]. Estas pruebas se realizan para controlar su salud y la del beb. Los [employer]ARK Corporation de sangre se Radiographer, therapeutic para [employer] niveles de algunos compuestos de la sangre (hemoglobina). La anemia (bajo nivel de hemoglobina) es frecuente durante el embarazo. Para prevenirla, se administran hierro y vitaminas. Tambin le tomarn nuevas anlisis para descartar diabetes. Podrn repetirle algunas de las Hovnanian Enterprises hicieron previamente.  En cada visita le medirn el tamao del tero. Esto permite asegurar que el beb se desarrolla adecuadamente, segn la fecha del embarazo.  Le controlarn la presin arterial en cada visita prenatal. Esto es para asegurarse de que no sufre toxemia.  Le harn un anlisis de orina en cada visita prenatal, para descartar infecciones, diabetes y la presencia de protenas.  Tambin en cada visita controlarn su peso. Esto se  realiza para asegurarse que aumenta de peso al ritmo indicado y que usted y su beb evolucionan normalmente.  En algunas ocasiones se realiza una prueba de ultrasonido para confirmar el correcto desarrollo y evolucin del beb. Esta prueba se realiza con ondas sonoras inofensivas para el beb, de modo que el profesional pueda calcular ms precisamente la fecha del [place].  Analice con su mdico los analgsicos y la anestesia que recibir durante el May de parto y [place].  Comente la posibilidad de que necesite una cesrea y qu anestesia se recibir.  Informe a su mdico si sufre violencia familiar mental o fsica. A veces, se indica la prueba especializada sin estrs, la prueba de tolerancia a las contracciones y el perfil biofsico para asegurarse de que el beb no tiene problemas. El estudio del lquido amnitico que rodea al beb se llama amniocentesis. El lquido amnitico se obtiene introduciendo una aguja en el vientre (abdomen ). En ocasiones se lleva a cabo cerca del final del embarazo, si es necesario inducir a un parto. En este caso se realiza para asegurarse que los pulmones del beb estn lo suficientemente maduros como para que pueda vivir fuera del tero. Si los pulmones no han madurado y es peligroso que el beb nazca, se Building services engineer a la madre una inyeccin de [place] , 1 a 2 [address]  [address] antes del [address]. Vivia Budge ayuda a que los pulmones del beb maduren y sea ms seguro su nacimiento.  CAMBIOS QUE OCURREN EN EL TERCER TRIMESTRE DEL EMBARAZO  Su organismo atravesar numerosos cambios durante el [place]. Estos  pueden variar de Neomia Dear persona a otra. [place] con el profesional que la asiste acerca los cambios que usted note y que la preocupen.   Durante el ltimo trimestre probablemente sienta un aumento del apetito. Es normal tener "antojos" de Development worker, community. Esto vara de Neomia Dear persona a otra y de un embarazo a Therapist, art.  Podrn aparecer las primeras estras en las caderas, abdomen y [place].  Estos son cambios normales del cuerpo durante el [place]. No existen medicamentos ni ejercicios que puedan prevenir [employer].  La constipacin puede tratarse con un laxante o agregando fibra a su dieta. Beber grandes cantidades de lquidos, tomar fibras en forma de vegetales, frutas y granos integrales es de gran [place].  Tambin es beneficioso practicar actividad fsica. Si ha sido una persona Engineer, mining, podr continuar con la [employer]ley[employer]son de las actividades durante el mismo. Si ha sido [employer], puede ser beneficioso que comience con un programa de ejercicios, Museum/gallery exhibitions officer. Consulte con el profesional que la asiste antes de comenzar un programa de ejercicios.  Evite el consumo de cigarrillos, el alcohol, los medicamentos no recetados y las "drogas de la calle" durante el Psychiatrist. Estas sustancias qumicas afectan la formacin y el desarrollo del beb. Evite estas sustancias durante todo el embarazo para asegurar el nacimiento de un beb sano.  Podr sentir dolor de espalda, tener vrices en las venas y hemorroides, o si ya los sufra, pueden [place].  Durante el tercer trimestre se cansar con ms facilidad, lo cual es normal.  Los movimientos del beb pueden ser ms fuertes y con ms frecuencia.  Puede que note dificultades para respirar normalmente.  El ombligo puede salir hacia afuera.  A veces sale Veterinary surgeon de las [place], que se llama Product manager.  Podr aparecer Neomia Dear secrecin mucosa con sangre. Esto suele ocurrir General Electric unos [address] y Neomia Dear semana antes del [place]. INSTRUCCIONES PARA EL CUIDADO EN EL HOGAR   Cumpla con las citas de control. Siga las indicaciones del mdico con respecto al uso de [place], los ejercicios y la dieta.  Durante el embarazo debe obtener nutrientes para usted y para su beb. Consuma alimentos balanceados a intervalos regulares. Elija alimentos como carne, pescado, [place] y otros productos lcteos descremados,  vegetales, frutas, panes integrales y cereales. El [employer]ot informar cul es el aumento de peso ideal.  Las relaciones sexuales pueden continuarse hasta casi el final del embarazo, si no se presentan otros problemas como prdida prematura (antes de [place]) de lquido amnitico, hemorragia vaginal o dolor en el vientre (abdominal).  Realice Tesoro Corporation, si no tiene restricciones. Consulte con el profesional que la asiste si no sabe con certeza si determinados ejercicios son seguros. El mayor aumento de peso se producir en los ltimos 2 trimestres del Psychiatrist. El ejercicio ayuda a:  Engineering geologist.  Mantenerse en forma para el trabajo de parto y [place] .  Perder peso despus del parto.  Haga reposo con frecuencia, con las piernas elevadas, o segn lo necesite para evitar los calambres y el dolor de cintura.  Use un buen sostn o como los que se usan para hacer deportes para Paramedic la sensibilidad de las [place]. Tambin puede serle til si lo [place] mientras duerme. Si pierde Product manager, podr [employer]in.  No utilice la baera con agua caliente, baos turcos y saunas.   Colquese el cinturn de seguridad cuando conduzca. Este la proteger a usted y al beb en caso de  accidente.  Evite comer carne cruda y el contacto con los utensilios y desperdicios de los gatos. Estos elementos contienen grmenes que pueden causar defectos de nacimiento en el beb.  Es fcil perder algo de orina durante el [place]. Apretar y Chief Operating Officer los msculos de la pelvis la ayudar con este problema. Practique detener la miccin cuando est en el bao. Estos son los mismos msculos que Development worker, international aid. Son [employer]PP[employer] Partners mismos msculos que utiliza cuando trata de evitar despedir gases. Puede practicar apretando estos msculos WellPoint, y repetir esto tres veces por da aproximadamente. Una vez que conozca qu msculos debe apretar, no realice estos ejercicios durante la  miccin. Puede favorecerle una infeccin si la orina vuelve hacia atrs.  Pida ayuda si tienen necesidades financieras, teraputicas o nutricionales. El profesional podr ayudarla con respecto a estas necesidades, o derivarla a otros especialistas.  Haga una lista de nmeros telefnicos de emergencia y tngalos disponibles.  Planifique como obtener ayuda de familiares o amigos cuando regrese a Programmer, applications hospital.  Hacer un ensayo sobre la partida al hospital.  [place] clases prenatales con el padre para entender, practicar y hacer preguntas sobre el [place] de parto y el alumbramiento.  Preparar la habitacin del beb / busque Fatima Blank.  No viaje fuera de la ciudad a menos que sea absolutamente necesario y con el asesoramiento de su mdico.  Use slo zapatos de tacn bajo o sin tacn para tener mejor equilibrio y Automotive engineer cadas. USO DE MEDICAMENTOS Y CONSUMO DE DROGAS DURANTE EL [facility]   Tome las vitaminas apropiadas para esta etapa tal como se le indic. Las vitaminas deben contener un miligramo de cido flico. Guarde todas las vitaminas fuera del alcance de los nios. La ingestin de slo un par de vitaminas o tabletas que contengan hierro pueden ocasionar la Newmont Mining en un beb o en un nio pequeo.  Evite el uso de The Mutual of Omaha, incluyendo hierbas, medicamentos de [place], sin receta o que no hayan sido sugeridos por su mdico. Slo tome medicamentos de venta libre o medicamentos recetados para Chief Technology Officer, Environmental health practitioner o fiebre como lo indique su mdico. No tome aspirina, ibuprofeno o naproxeno excepto que su mdico se lo indique.  Infrmele al profesional si consume alguna droga.  El alcohol se relaciona con ciertos defectos congnitos. Incluye el sndrome de alcoholismo fetal. Debe evitar absolutamente el consumo de alcohol, en cualquier forma. El fumar produce baja tasa de natalidad y bebs prematuros.  Las drogas ilegales o de la calle son muy perjudiciales para el  beb. Estn absolutamente prohibidas. Un beb que nace de American Express, ser adicto al nacer. Ese beb tendr los mismos sntomas de abstinencia que un adulto. SOLICITE ATENCIN MDICA SI:  Tiene preguntas o preocupaciones relacionadas con el embarazo. Es mejor que llame para formular las preguntas si no puede esperar hasta la prxima visita, que sentirse preocupada por ellas.  SOLICITE ATENCIN MDICA DE INMEDIATO SI:   La temperatura oral le sube a ms de 38,9 C (102 F) o lo que su mdico le indique.  Tiene una prdida de lquido por la vagina (canal de parto). Si sospecha una ruptura de las [place], tmese la temperatura y llame al profesional para informarlo sobre esto.  Observa unas pequeas manchas, una hemorragia vaginal o elimina cogulos. Notifique al profesional acerca de la cantidad y de cuntos apsitos est utilizando.  Presenta un olor desagradable en la secrecin vaginal y observa un cambio en el color, de transparente a blanco.  Ha vomitado durante ms de 24 horas.  Siente escalofros o le sube la fiebre.  Le falta el aire.  Siente ardor al Beatrix Shipper.  [place]aja o sube ms de 2 libras (900 g), o segn lo indicado por el profesional que la asiste.  Observa que sbitamente se le hinchan el rostro, las manos, los pies o las piernas.  Siente dolor en el vientre (abdominal). Las [employer]ul en el ligamento redondo son Neomia Dear causa benigna frecuente de dolor abdominal durante el embarazo. El profesional que la asiste deber evaluarla.  Presenta dolor de cabeza intenso que no se [place].  Tiene problemas visuales, visin doble o borrosa.  Si no siente los movimientos del beb durante ms de 1 hora. Si piensa que el beb no se mueve tanto como lo haca habitualmente, coma algo que Psychologist, clinical y Target Corporation lado izquierdo durante [place]. El beb debe moverse al menos 4  5 veces por hora. Comunquese inmediatamente si el beb se mueve menos que lo indicado.  Se cae, se  ve involucrada en un accidente automovilstico o sufre algn tipo de traumatismo.  En su hogar hay violencia mental o fsica. Document Released: 10/11/2004 Document Revised: 09/26/2011 [facility] Patient Information 2014 [place], [place].

## 2012-09-24 NOTE — Progress Notes (Signed)
PHQ-9 performed and score is 3, no difficulty, 1 each on 1, 3, and 4.

## 2012-09-24 NOTE — Progress Notes (Signed)
Megan Buchanan is a 27 y.o. G2P1001  at [redacted]w[redacted]d for routine follow up.  She reports mild dysuria, irregular contractions  See flow sheet for details.  A/P: Pregnancy at [redacted]w[redacted]d.  Doing well.   Pregnancy issues include low lying placenta, on pelvic rest  Tdapwas not given today, plan for next visit 1 hour glucola, CBC, RPR, and HIV were done today.   Pregnancy medical home forms were done today and reviewed.   RH status was reviewed and pt does not need Rhogam.  Rhogam was not given today.   Preterm labor precautions reviewed. Kick counts reviewed. With continued dysuria and negative culture last time will culture once more and treat if it grows anything.  Follow up 2 weeks in ob clinic

## 2012-09-25 LAB — CULTURE, OB URINE: Organism ID, Bacteria: NO GROWTH

## 2012-09-25 LAB — RPR

## 2012-10-09 ENCOUNTER — Ambulatory Visit (INDEPENDENT_AMBULATORY_CARE_PROVIDER_SITE_OTHER): Payer: Self-pay | Admitting: Family Medicine

## 2012-10-09 VITALS — BP 100/70 | Wt 145.0 lb

## 2012-10-09 DIAGNOSIS — O4410 Placenta previa with hemorrhage, unspecified trimester: Secondary | ICD-10-CM

## 2012-10-09 DIAGNOSIS — Z23 Encounter for immunization: Secondary | ICD-10-CM

## 2012-10-09 NOTE — Addendum Note (Signed)
Addended by: Henri Medal on: 10/09/2012 12:28 PM   Modules accepted: Orders

## 2012-10-09 NOTE — Progress Notes (Signed)
Megan Buchanan is a 27 y.o. G2P1001 at [redacted]w[redacted]d for routine follow up. Without concerns or complaints. No dysuria. Reports irregular contractions and frequent fetal movement. No LOF, vaginal bleeding/discharge.   A/P:  IUP at [redacted]w[redacted]d doing well  Bedside U/S shows vertex presentation without adequate view of placenta  Follow up U/S ordered for assessment of placenta positioning  Exam reassuring  See flowsheet for details.   TDaP today, flu vaccine referred to an outpatient pharmacy due to cost considerations as [employer] does not cover it.

## 2012-10-09 NOTE — Addendum Note (Signed)
Addended by: Janit Pagan T on: 10/09/2012 01:42 PM   Modules accepted: Level of Service

## 2012-10-09 NOTE — Progress Notes (Signed)
OB Attending Note: Marcus Groll,MD I  have seen and examined this patient, reviewed their chart. I have discussed this patient with the resident. I agree with the resident's findings, assessment and care plan.  Early trimester OB U/S showed low lying placenta,patient does not have any abdominal pain or contraction,no vaginal bleeding.Plan is to obtain follow up U/S today preferably TVUS. F/U with PCP in 2wks.

## 2012-10-27 ENCOUNTER — Ambulatory Visit (INDEPENDENT_AMBULATORY_CARE_PROVIDER_SITE_OTHER): Payer: Self-pay | Admitting: Family Medicine

## 2012-10-27 VITALS — BP 94/64 | Wt 151.0 lb

## 2012-10-27 DIAGNOSIS — O34219 Maternal care for unspecified type scar from previous cesarean delivery: Secondary | ICD-10-CM | POA: Insufficient documentation

## 2012-10-27 DIAGNOSIS — Z3483 Encounter for supervision of other normal pregnancy, third trimester: Secondary | ICD-10-CM

## 2012-10-27 DIAGNOSIS — G5601 Carpal tunnel syndrome, right upper limb: Secondary | ICD-10-CM

## 2012-10-27 DIAGNOSIS — Z3480 Encounter for supervision of other normal pregnancy, unspecified trimester: Secondary | ICD-10-CM

## 2012-10-27 DIAGNOSIS — O3421 Maternal care for scar from previous cesarean delivery: Secondary | ICD-10-CM

## 2012-10-27 DIAGNOSIS — G5600 Carpal tunnel syndrome, unspecified upper limb: Secondary | ICD-10-CM | POA: Insufficient documentation

## 2012-10-27 DIAGNOSIS — K625 Hemorrhage of anus and rectum: Secondary | ICD-10-CM

## 2012-10-27 NOTE — Assessment & Plan Note (Signed)
BL symptoms, likely edema related Discussed that this will likely resolve when pregnancy ends Recommended cock-up splints as desired

## 2012-10-27 NOTE — Progress Notes (Signed)
Megan Buchanan is a 27 y.o. G2P1001  at [redacted]w[redacted]d for routine follow up.  She reports rectal bleeding with blood on stool and when she wipes, last occurred 1 week ago, sure it is not VB. Notes straining at stool and only having 1 dificult BM daily as opposed to 3 times daily usually. Also c/o numbness and tingling of fingertips in the am that resolves in 30 min to an hour. No contractions.   See flow sheet for details.  A/P: Pregnancy at [redacted]w[redacted]d.  Doing well.   Pregnancy issues include Low lying placenta, repeat US shows it has resolved, placed in box to scan.   Infant feeding choiceBreast Contraception choice Nexplanon Infant circumcision desired no  Tdapwas given previously GBS/GC/CZ testing was not performed today. Will perform at 36 weeks.   Preterm labor precautions reviewed. Safe sleep discussed. Kick counts reviewed. Follow up 2 weeks.  Follow up with Dr. Shawnie Pons as well to discuss consent for VBAC, she very much wants to pursue vaginal delivery.

## 2012-10-27 NOTE — Patient Instructions (Addendum)
Come back for a follow up in 2 weeks  Schedule an appointment with Dr. Shawnie Pons to discuss Vaginal birth after C section (VBAC)  For your finger numbness, this sounds like carpel tunnel syndrome. You can try cock-up splints at night which may help this problem  For your hemorrhoids, keep doing your shakes. OUr goal is for you not to strain at stool.  Try your apple fiber once daily   Data processing manager trimestre  (Pregnancy - Third Trimester) El tercer trimestre del embarazo (los ltimos 3 meses) es el perodo en el cual tanto usted como su beb crecen con ms rapidez. El beb alcanza un largo de aproximadamente 50 cm. y pesa entre 2,700 y 4,500 kg. El beb gana ms tejido graso y est listo para la vida fuera del cuerpo de la [place]. Mientras estn en el interior, los bebs tienen perodos de sueo y vigilia, Warehouse manager y tienen hipo. Quizs sienta pequeas contracciones del tero. Este es el falso trabajo de [place]. Tambin se las conoce como contracciones de [place] . Es como una prctica del parto. Los problemas ms habituales de esta etapa del embarazo incluyen mayor dificultad para respirar, hinchazn de las manos y los pies por retencin de lquidos y la necesidad de Geographical information systems officer con ms frecuencia debido a que el tero y el beb presionan sobre la vejiga.  EXAMENES PRENATALES   Durante los Manpower Inc, deber seguir realizndose anlisis de [place]. Estas pruebas se realizan para controlar su salud y la del beb. Los AR[employer] Corporation de sangre se Radiographer, therapeutic para [employer] niveles de algunos compuestos de la sangre (hemoglobina). La anemia (bajo nivel de hemoglobina) es frecuente durante el embarazo. Para prevenirla, se administran hierro y vitaminas. Tambin le tomarn nuevas anlisis para descartar diabetes. Podrn repetirle algunas de las Hovnanian Enterprises hicieron previamente.  En cada visita le medirn el tamao del tero. Esto permite asegurar que el beb se desarrolla adecuadamente,  segn la fecha del embarazo.  Le controlarn la presin arterial en cada visita prenatal. Esto es para asegurarse de que no sufre toxemia.  Le harn un anlisis de orina en cada visita prenatal, para descartar infecciones, diabetes y la presencia de protenas.  Tambin en cada visita controlarn su peso. Esto se realiza para asegurarse que aumenta de peso al ritmo indicado y que usted y su beb evolucionan normalmente.  En algunas ocasiones se realiza una prueba de ultrasonido para confirmar el correcto desarrollo y evolucin del beb. Esta prueba se realiza con ondas sonoras inofensivas para el beb, de modo que el profesional pueda calcular ms precisamente la fecha del [place].  Analice con su mdico los analgsicos y la anestesia que recibir durante el [place] de parto y [place].  Comente la posibilidad de que necesite una cesrea y qu anestesia se recibir.  Informe a su mdico si sufre violencia familiar mental o fsica. A veces, se indica la prueba especializada sin estrs, la prueba de tolerancia a las contracciones y el perfil biofsico para asegurarse de que el beb no tiene problemas. El estudio del lquido amnitico que rodea al beb se llama amniocentesis. El lquido amnitico se obtiene introduciendo una aguja en el vientre (abdomen ). En ocasiones se lleva a cabo cerca del final del embarazo, si es necesario inducir a un parto. En este caso se realiza para asegurarse que los pulmones del beb estn lo suficientemente maduros como para que pueda vivir fuera del tero. Si los pulmones no han madurado y es peligroso que  el beb nazca, se administrar a la madre una inyeccin de [place] , 1 a 2 [address]  [address] antes del [address]. Vivia Budge ayuda a que los pulmones del beb maduren y sea ms seguro su nacimiento.  CAMBIOS QUE OCURREN EN EL TERCER TRIMESTRE DEL EMBARAZO  Su organismo atravesar numerosos cambios durante el [place]. Estos pueden variar de Neomia Dear persona a otra. Converse con el profesional  que la asiste acerca los cambios que usted note y que la preocupen.   Durante el ltimo trimestre probablemente sienta un aumento del apetito. Es normal tener "antojos" de Development worker, community. Esto vara de Neomia Dear persona a otra y de un embarazo a Therapist, art.  Podrn aparecer las primeras estras en las caderas, abdomen y [place]. Estos son cambios normales del cuerpo durante el [place]. No existen medicamentos ni ejercicios que puedan prevenir CarMax.  La constipacin puede tratarse con un laxante o agregando fibra a su dieta. Beber grandes cantidades de lquidos, tomar fibras en forma de vegetales, frutas y granos integrales es de gran [place].  Tambin es beneficioso practicar actividad fsica. Si ha sido una persona Engineer, mining, podr continuar con la Harley[employer]son de las actividades durante el mismo. Si ha sido [employer], puede ser beneficioso que comience con un programa de ejercicios, Museum/gallery exhibitions officer. Consulte con el profesional que la asiste antes de comenzar un programa de ejercicios.  Evite el consumo de cigarrillos, el alcohol, los medicamentos no recetados y las "drogas de la calle" durante el Psychiatrist. Estas sustancias qumicas afectan la formacin y el desarrollo del beb. Evite estas sustancias durante todo el embarazo para asegurar el nacimiento de un beb sano.  Podr sentir dolor de espalda, tener vrices en las venas y hemorroides, o si ya los sufra, pueden [place].  Durante el tercer trimestre se cansar con ms facilidad, lo cual es normal.  Los movimientos del beb pueden ser ms fuertes y con ms frecuencia.  Puede que note dificultades para respirar normalmente.  El ombligo puede salir hacia afuera.  A veces sale Veterinary surgeon de las [place], que se llama Product manager.  Podr aparecer Neomia Dear secrecin mucosa con sangre. Esto suele ocurrir General Electric unos [address] y Neomia Dear semana antes del [place]. INSTRUCCIONES PARA EL CUIDADO EN EL HOGAR   Cumpla con las citas  de control. Siga las indicaciones del mdico con respecto al uso de [place], los ejercicios y la dieta.  Durante el embarazo debe obtener nutrientes para usted y para su beb. Consuma alimentos balanceados a intervalos regulares. Elija alimentos como carne, pescado, [place] y otros productos lcteos descremados, vegetales, frutas, panes integrales y cereales. El Office Depot informar cul es el aumento de peso ideal.  Las relaciones sexuales pueden continuarse hasta casi el final del embarazo, si no se presentan otros problemas como prdida prematura (antes de [place]) de lquido amnitico, hemorragia vaginal o dolor en el vientre (abdominal).  Realice Tesoro Corporation, si no tiene restricciones. Consulte con el profesional que la asiste si no sabe con certeza si determinados ejercicios son seguros. El mayor aumento de peso se producir en los ltimos 2 trimestres del Psychiatrist. El ejercicio ayuda a:  Engineering geologist.  Mantenerse en forma para el trabajo de parto y [place] .  Perder peso despus del parto.  Haga reposo con frecuencia, con las piernas elevadas, o segn lo necesite para evitar los calambres y el dolor de cintura.  Use un buen sostn o como los que se usan para hacer deportes para  aliviar la sensibilidad de las [place]. Tambin puede serle til si lo [place] mientras duerme. Si pierde Product manager, podr Parker [employer]in.  No utilice la baera con agua caliente, baos turcos y saunas.   Colquese el cinturn de seguridad cuando conduzca. Este la proteger a usted y al beb en caso de accidente.  Evite comer carne cruda y el contacto con los utensilios y desperdicios de los gatos. Estos elementos contienen grmenes que pueden causar defectos de nacimiento en el beb.  Es fcil perder algo de orina durante el [place]. Apretar y Chief Operating Officer los msculos de la pelvis la ayudar con este problema. Practique detener la miccin cuando est en el bao. Estos son los  mismos msculos que Development worker, international aid. Son [employer]PP[employer] Partners mismos msculos que utiliza cuando trata de evitar despedir gases. Puede practicar apretando estos msculos WellPoint, y repetir esto tres veces por da aproximadamente. Una vez que conozca qu msculos debe apretar, no realice estos ejercicios durante la miccin. Puede favorecerle una infeccin si la orina vuelve hacia atrs.  Pida ayuda si tienen necesidades financieras, teraputicas o nutricionales. El profesional podr ayudarla con respecto a estas necesidades, o derivarla a otros especialistas.  Haga una lista de nmeros telefnicos de emergencia y tngalos disponibles.  Planifique como obtener ayuda de familiares o amigos cuando regrese a Programmer, applications hospital.  Hacer un ensayo sobre la partida al hospital.  [place] clases prenatales con el padre para entender, practicar y hacer preguntas sobre el [place] de parto y el alumbramiento.  Preparar la habitacin del beb / busque Fatima Blank.  No viaje fuera de la ciudad a menos que sea absolutamente necesario y con el asesoramiento de su mdico.  Use slo zapatos de tacn bajo o sin tacn para tener mejor equilibrio y Automotive engineer cadas. USO DE MEDICAMENTOS Y CONSUMO DE DROGAS DURANTE EL [facility]   Tome las vitaminas apropiadas para esta etapa tal como se le indic. Las vitaminas deben contener un miligramo de cido flico. Guarde todas las vitaminas fuera del alcance de los nios. La ingestin de slo un par de vitaminas o tabletas que contengan hierro pueden ocasionar la [place] [employer] en un beb o en un nio pequeo.  Evite el uso de [employer] Mutual [employer] Omaha, incluyendo hierbas, medicamentos de [place], sin receta o que no hayan sido sugeridos por su mdico. Slo tome medicamentos de venta libre o medicamentos recetados para Chief Technology Officer, Environmental health practitioner o fiebre como lo indique su mdico. No tome aspirina, ibuprofeno o naproxeno excepto que su mdico se lo indique.  Infrmele al profesional si  consume alguna droga.  El alcohol se relaciona con ciertos defectos congnitos. Incluye el sndrome de alcoholismo fetal. Debe evitar absolutamente el consumo de alcohol, en cualquier forma. El fumar produce baja tasa de natalidad y bebs prematuros.  Las drogas ilegales o de la calle son muy perjudiciales para el beb. Estn absolutamente prohibidas. Un beb que nace de American Express, ser adicto al nacer. Ese beb tendr los mismos sntomas de abstinencia que un adulto. SOLICITE ATENCIN MDICA SI:  Tiene preguntas o preocupaciones relacionadas con el embarazo. Es mejor que llame para formular las preguntas si no puede esperar hasta la prxima visita, que sentirse preocupada por ellas.  SOLICITE ATENCIN MDICA DE INMEDIATO SI:   La temperatura oral le sube a ms de 38,9 C (102 F) o lo que su mdico le indique.  Tiene una prdida de lquido por la vagina (canal de parto). Si sospecha una ruptura de las  membranas, tmese la temperatura y llame al profesional para informarlo sobre esto.  Observa unas pequeas manchas, una hemorragia vaginal o elimina cogulos. Notifique al profesional acerca de la cantidad y de cuntos apsitos est utilizando.  Presenta un olor desagradable en la secrecin vaginal y observa un cambio en el color, de transparente a blanco.  Ha vomitado durante ms de 24 horas.  Siente escalofros o le sube la fiebre.  Le falta el aire.  Siente ardor al Beatrix Shipper.  Baja o sube ms de 2 libras (900 g), o segn lo indicado por el profesional que la asiste.  Observa que sbitamente se le hinchan el rostro, las manos, los pies o las piernas.  Siente dolor en el vientre (abdominal). Las [employer] en el ligamento redondo son Neomia Dear causa benigna frecuente de dolor abdominal durante el embarazo. El profesional que la asiste deber evaluarla.  Presenta dolor de cabeza intenso que no se [place].  Tiene problemas visuales, visin doble o borrosa.  Si no siente los movimientos  del beb durante ms de 1 hora. Si piensa que el beb no se mueve tanto como lo haca habitualmente, coma algo que Psychologist, clinical y Target Corporation lado izquierdo durante [place]. El beb debe moverse al menos 4  5 veces por hora. Comunquese inmediatamente si el beb se mueve menos que lo indicado.  Se cae, se ve involucrada en un accidente automovilstico o sufre algn tipo de traumatismo.  En su hogar hay violencia mental o fsica. Document Released: 10/11/2004 Document Revised: 09/26/2011 [facility] Patient Information 2014 [place], [place].

## 2012-10-27 NOTE — Assessment & Plan Note (Signed)
Story consistent with bleeding hemmrhoid She is very sure it is not vaginal bleeding Constipation has been a struggle and she is straining at stool.  Discussed adding more fiber back and shooting for BID stools as her usual is after each meal.

## 2012-11-13 ENCOUNTER — Ambulatory Visit (INDEPENDENT_AMBULATORY_CARE_PROVIDER_SITE_OTHER): Payer: Self-pay | Admitting: Family Medicine

## 2012-11-13 VITALS — BP 105/68 | Temp 98.0°F | Wt 153.0 lb

## 2012-11-13 DIAGNOSIS — Z3483 Encounter for supervision of other normal pregnancy, third trimester: Secondary | ICD-10-CM

## 2012-11-13 DIAGNOSIS — G5600 Carpal tunnel syndrome, unspecified upper limb: Secondary | ICD-10-CM

## 2012-11-13 DIAGNOSIS — M2560 Stiffness of unspecified joint, not elsewhere classified: Secondary | ICD-10-CM

## 2012-11-13 DIAGNOSIS — Z3480 Encounter for supervision of other normal pregnancy, unspecified trimester: Secondary | ICD-10-CM

## 2012-11-13 DIAGNOSIS — G5601 Carpal tunnel syndrome, right upper limb: Secondary | ICD-10-CM

## 2012-11-13 LAB — COMPLETE METABOLIC PANEL WITH GFR
Alkaline Phosphatase: 198 U/L — ABNORMAL HIGH (ref 39–117)
BUN: 8 mg/dL (ref 6–23)
GFR, Est Non African American: >89 mL/min
Glucose, Bld: 87 mg/dL (ref 70–99)
Sodium: 136 mEq/L (ref 135–145)
Total Bilirubin: 0.4 mg/dL (ref 0.3–1.2)
Total Protein: 7 g/dL (ref 6.0–8.3)

## 2012-11-13 LAB — RHEUMATOID FACTOR: Rhuematoid fact SerPl-aCnc: <10 IU/mL (ref <=14)

## 2012-11-13 LAB — CBC WITH DIFFERENTIAL/PLATELET
HCT: 33.9 % — ABNORMAL LOW (ref 36.0–46.0)
Hemoglobin: 11.9 g/dL — ABNORMAL LOW (ref 12.0–15.0)
Lymphocytes Relative: 24 % (ref 12–46)
Lymphs Abs: 2 10*3/uL (ref 0.7–4.0)
Monocytes Absolute: 0.5 10*3/uL (ref 0.1–1.0)
Monocytes Relative: 6 % (ref 3–12)
Neutro Abs: 5.7 10*3/uL (ref 1.7–7.7)
WBC: 8.3 10*3/uL (ref 4.0–10.5)

## 2012-11-13 NOTE — Patient Instructions (Signed)
It was great to see you again today!  Your next appointment will be with Dr. Shawnie Pons to talk about you vaginal birth after C section  Parto vaginal luego de una cesrea (Vaginal Birth After Cesarean Delivery) Un parto vaginal luego de un parto por cesrea es dar a luz por la vagina luego de haber dado a luz por medio de una intervencin [place]. En el pasado, si una mujer tena un beb por cesrea, todos los partos posteriores deban hacerse por cesrea. Esto ya no es as. Puede ser seguro para la mam intentar un parto vaginal luego de una cesrea. La decisin final de tener un parto vaginal o por cesrea debe tomarse en conjunto, entre la paciente y el mdico. [place] riesgos y los beneficios debern evaluarse con relacin a los motivos y al tipo de cesrea previa LAS MUJERES QUE QUIEREN TENER UN PARTO VAGINAL, DEBEN CONSULTAR CON SU MDICO PARA ASEGURARSE QUE:  La cesrea anterior se realiz con una incisin uterina transversal (no con una incisin vertical clsica).  El canal de parto es lo suficientemente grande como para que pase el [place].  No ha sido sometida a otras operaciones del tero.  Durante el trabajo de parto le realizarn un monitoreo electrnico fetal, en todo momento.  Es necesario que haya un quirfano disponible y listo en caso de necesitar una cesrea de emergencia.  Un cirujano y personal de quirfano estarn disponibles en todo momento durante el [place] de parto, para realizar una cesrea en caso de ser necesario.  Habr un anestesista disponible en caso de necesitar una cesrea de emergencia.  La nursery est lista cuenta con personal especializado y el equipo disponible para cuidar al beb en caso de emergencia. BENEFICIOS  Permanencia ms breve en el hospital.  Menores costos en el parto, la nurse y el hospital.  Menos prdida de sangre y menos probabilidad de necesitar una transfusin sangunea.  Menos probabilidad de tener fiebre o molestias como consecuencia  de una ciruga mayor  Menos riesgo de cogulos sanguneos.  Menos riesgo de sufrir infecciones.  Recuperacin ms rpida luego del alta mdica.  Menos riesgo de complicaciones quirrgicas, como apertura o hernia de la incisin.  Disminucin del riesgo de lesiones a otros rganos  Menor riesgo de remocin del tero (histerectoma)  Menor riesgo de que la placenta cubra parcial o completamente la abertura del tero (placenta previa) en embarazos futuros  Posibilidad de tener una familia grande, si lo desea. RIESGOS  Ruptura del tero.  Si el tero se rompe Production manager.  Todas las complicaciones de [place] mayor y lesiones en otros rganos.  Hemorragia excesiva, cogulos e infeccin.  [place] Puntuacin Apgar baja (mtodo que evala al recin nacido segn su apariencia, pulso, muecas, actividad y respiracion) y ms riesgos para el beb.  Hay mas riesgo de ruptura del tero si se induce o aumenta el trabajo de [place].  Hay un mayor riesgo de ruptura uterina si se usan medicamentos para madurar el cuello. NO DEBE LLEVARSE A CABO SI:  La cesrea previa se realiz con una incicin vertical (clsica) o con forma de T, o usted no sabe cul de Lucent [employer] han practicado.  Ha sufrido ruptura del tero.  Le han practicado una ciruga de tero.  Tiene problemas mdicos u obsttricos.  El beb est en problemas.  Tuvo dos cesreas previas y ningn parto vaginal. OTRAS COSAS QUE DEBE SABER:  La anestesia peridural es segura.  Es seguro dar vuelta al beb si  se encuentra de nalgas (intentar una versin ceflica externa).  Es seguro intentarlo en caso de mellizos.  Los embarazos de ms de 40 semanas no tienen xito con este procedimiento.  Hay un aumento de fracasos en embarazadas obesas.  Hay un aumento del porcentaje de fracasos si el beb pesa 4 Kg o ms.  Hay aumento en el porcentaje de fracasos si el intervalo entre la operacin cesrea y el  parto vaginal es de menos de 19 meses.  Hay un aumento en el porcentaje de fracasos si ha sufrido preeclampsia hipertensin arterial, protenas en la orina e hinchazn del rostro y las extremidades.  El parto vaginal ser muy exitoso si tuvo un parto vaginal previo.  Tambin es [employer] caso que el [place] de parto comience espontneamente antes de la fecha.  El parto vaginal luego de Neomia Dear cesrea es similar a un parto espontneo vaginal normal. Es importante que converse con su mdico desde comienzos del Psychiatrist de modo que pueda Google, beneficios y opciones. De este modo tendr tiempo de decidir que es lo mejor en su caso particular en relacin a su parto por cesrea anterior. Hay que tener en cuenta que puede haber cambios en la madre durante el [place], lo que hace necesario cambiar su decisin o la del mdico. Los consejos, preocupaciones y decisiones debern documentarse en la historia clnica y debe ser firmada por todas las partes. Document Released: 06/20/2007 Document Revised: 03/26/2011 [facility] Patient Information 2014 [place], [place].

## 2012-11-13 NOTE — Progress Notes (Signed)
Megan Buchanan is a 27 y.o. G2P1001 at [redacted]w[redacted]d for routine follow up.  She reports several recent nosebleeds, worst was 6 days ago with blood running out of her mouth, flashes of light and scotoma daily, contractions when she had a fever about 1 week ago. She denies HA, and RUQ pain.  Also concerned about BL hand numbness and stiffness, mother with Hx of RA developing shortly after a pregnancy at age of 78, describes numbness and flicking c/w carpel tunnel, but 15-30 min joint stiffness in BL hands when she first wakes up c/w RA, unable to get cock-up splints so far. Denies red hot swollen joints ever.    See flow sheet for details. BL hands- no swollen or erythematous joints.  Reflexes normal   A/P: Pregnancy at [redacted]w[redacted]d.  Doing well.   Pregnancy issues include Low lying placenta, repeat US shows it has resolved    GBS/GC/CZ testing was not performed today, would prefer to wait for 36 weeks mark. VBAC- next visit with Dr. Shawnie Pons for consent Preterm labor precautions reviewed. Safe sleep discussed. Kick counts reviewed. Follow up 2 weeks with Dr. Shawnie Pons  Hand stiffness- L hand cock up splint provided to try at night, advised not to buy 2nd one if doesn't help, RA labs drawn  - would not usually have such a low threshold but will be a little more quick to test with concurrent pregnancy Epistaxis and scotoma- although she is normotensive will proceed with pre-eclampsia labs

## 2012-11-14 ENCOUNTER — Telehealth: Payer: Self-pay | Admitting: Family Medicine

## 2012-11-14 LAB — PROTEIN / CREATININE RATIO, URINE: Total Protein, Urine: <3 mg/dL

## 2012-11-14 NOTE — Telephone Encounter (Signed)
Called Dr. Erin Fulling (on call Ob attending) to discuss lab results. She ruled out for pre-eclampsia. She also does not have any specific markers for RA. She does have an elevated ESR (120, Nl in 3rd trimester is 30-70) and CRP (0.8, high nL is 0.6).  She advised watchful waiting and repeat ESR post partum.   Will proceed with cock-up splints as carpel tunnel syndrome from edema is most likely and watch for symptomatic relief.   I informed patient and she will follow up as scheduled.   Murtis Sink, MD [facility] Resident, PGY-2 11/14/2012, 1:44 PM

## 2012-11-19 ENCOUNTER — Encounter: Payer: Self-pay | Admitting: Family Medicine

## 2012-12-01 ENCOUNTER — Inpatient Hospital Stay (HOSPITAL_COMMUNITY)
Admission: AD | Admit: 2012-12-01 | Discharge: 2012-12-03 | DRG: 775 | Disposition: A | Discharge status: Home / Self Care | Payer: Medicaid Other | Source: Ambulatory Visit | Attending: Family Medicine | Admitting: Family Medicine

## 2012-12-01 ENCOUNTER — Encounter (HOSPITAL_COMMUNITY): Payer: Self-pay

## 2012-12-01 ENCOUNTER — Ambulatory Visit (INDEPENDENT_AMBULATORY_CARE_PROVIDER_SITE_OTHER): Payer: Medicaid Other | Admitting: Family Medicine

## 2012-12-01 ENCOUNTER — Inpatient Hospital Stay (HOSPITAL_COMMUNITY): Payer: Medicaid Other | Admitting: Anesthesiology

## 2012-12-01 ENCOUNTER — Encounter (HOSPITAL_COMMUNITY): Payer: Medicaid Other | Admitting: Anesthesiology

## 2012-12-01 VITALS — BP 117/77 | Temp 98.1°F | Wt 155.0 lb

## 2012-12-01 DIAGNOSIS — Z3483 Encounter for supervision of other normal pregnancy, third trimester: Secondary | ICD-10-CM

## 2012-12-01 DIAGNOSIS — Z331 Pregnant state, incidental: Secondary | ICD-10-CM

## 2012-12-01 DIAGNOSIS — O4290 Premature rupture of membranes, unspecified as to length of time between rupture and onset of labor, unspecified weeks of gestation: Secondary | ICD-10-CM

## 2012-12-01 DIAGNOSIS — Z3480 Encounter for supervision of other normal pregnancy, unspecified trimester: Secondary | ICD-10-CM

## 2012-12-01 DIAGNOSIS — O34219 Maternal care for unspecified type scar from previous cesarean delivery: Secondary | ICD-10-CM | POA: Diagnosis present

## 2012-12-01 LAB — CBC
HCT: 35 % — ABNORMAL LOW (ref 36.0–46.0)
MCH: 30.3 pg (ref 26.0–34.0)
MCV: 86.2 fL (ref 78.0–100.0)
Platelets: 221 10*3/uL (ref 150–400)
RBC: 4.06 MIL/uL (ref 3.87–5.11)
RDW: 14.1 % (ref 11.5–15.5)

## 2012-12-01 LAB — TYPE AND SCREEN: Antibody Screen: NEGATIVE

## 2012-12-01 LAB — OB RESULTS CONSOLE GBS: GBS: NEGATIVE

## 2012-12-01 LAB — GROUP B STREP BY PCR: Group B strep by PCR: NEGATIVE

## 2012-12-01 LAB — POCT FERNING: Ferning, POC: POSITIVE

## 2012-12-01 LAB — RPR: RPR Ser Ql: NONREACTIVE

## 2012-12-01 MED ORDER — FENTANYL 2.5 MCG/ML BUPIVACAINE 1/10 % EPIDURAL INFUSION (WH - ANES)
14.0000 mL/h | INTRAMUSCULAR | Status: DC | PRN
  Filled 2012-12-01: qty 125

## 2012-12-01 MED ORDER — EPHEDRINE 5 MG/ML INJ
10.0000 mg | INTRAVENOUS | Status: DC | PRN
  Filled 2012-12-01: qty 2

## 2012-12-01 MED ORDER — ONDANSETRON HCL 4 MG/2ML IJ SOLN: 4.0000 mg | Freq: Four times a day (QID) | INTRAMUSCULAR | Status: DC | PRN

## 2012-12-01 MED ORDER — IBUPROFEN 600 MG PO TABS
600.0000 mg | ORAL_TABLET | Freq: Four times a day (QID) | ORAL | Status: DC | PRN
  Administered 2012-12-02: 600 mg via ORAL
  Filled 2012-12-01: qty 1

## 2012-12-01 MED ORDER — LIDOCAINE HCL (PF) 1 % IJ SOLN
INTRAMUSCULAR | Status: DC | PRN
  Administered 2012-12-01 (×2): 9 mL

## 2012-12-01 MED ORDER — PHENYLEPHRINE 40 MCG/ML (10ML) SYRINGE FOR IV PUSH (FOR BLOOD PRESSURE SUPPORT)
80.0000 ug | PREFILLED_SYRINGE | INTRAVENOUS | Status: DC | PRN
  Filled 2012-12-01: qty 10
  Filled 2012-12-01: qty 2

## 2012-12-01 MED ORDER — TERBUTALINE SULFATE 1 MG/ML IJ SOLN: 0.2500 mg | Freq: Once | INTRAMUSCULAR | Status: AC | PRN

## 2012-12-01 MED ORDER — LACTATED RINGERS IV SOLN: 500.0000 mL | Freq: Once | INTRAVENOUS | Status: DC

## 2012-12-01 MED ORDER — CITRIC ACID-SODIUM CITRATE 334-500 MG/5ML PO SOLN: 30.0000 mL | ORAL | Status: DC | PRN

## 2012-12-01 MED ORDER — FENTANYL CITRATE 0.05 MG/ML IJ SOLN
100.0000 ug | INTRAMUSCULAR | Status: DC | PRN
  Administered 2012-12-01: 100 ug via INTRAVENOUS
  Filled 2012-12-01: qty 2

## 2012-12-01 MED ORDER — FLEET ENEMA 7-19 GM/118ML RE ENEM: 1.0000 | ENEMA | RECTAL | Status: DC | PRN

## 2012-12-01 MED ORDER — FENTANYL 2.5 MCG/ML BUPIVACAINE 1/10 % EPIDURAL INFUSION (WH - ANES)
INTRAMUSCULAR | Status: DC | PRN
  Administered 2012-12-01: 14 mL/h via EPIDURAL

## 2012-12-01 MED ORDER — OXYCODONE-ACETAMINOPHEN 5-325 MG PO TABS: 1.0000 | ORAL_TABLET | ORAL | Status: DC | PRN

## 2012-12-01 MED ORDER — OXYTOCIN 40 UNITS IN LACTATED RINGERS INFUSION - SIMPLE MED
62.5000 mL/h | INTRAVENOUS | Status: DC
  Filled 2012-12-01: qty 1000

## 2012-12-01 MED ORDER — OXYTOCIN BOLUS FROM INFUSION: 500.0000 mL | INTRAVENOUS | Status: DC

## 2012-12-01 MED ORDER — OXYTOCIN 40 UNITS IN LACTATED RINGERS INFUSION - SIMPLE MED
1.0000 m[IU]/min | INTRAVENOUS | Status: DC
  Administered 2012-12-01: 2 m[IU]/min via INTRAVENOUS

## 2012-12-01 MED ORDER — LACTATED RINGERS IV SOLN: 500.0000 mL | INTRAVENOUS | Status: DC | PRN

## 2012-12-01 MED ORDER — DIPHENHYDRAMINE HCL 50 MG/ML IJ SOLN: 12.5000 mg | INTRAMUSCULAR | Status: DC | PRN

## 2012-12-01 MED ORDER — EPHEDRINE 5 MG/ML INJ
10.0000 mg | INTRAVENOUS | Status: DC | PRN
  Filled 2012-12-01: qty 4
  Filled 2012-12-01: qty 2

## 2012-12-01 MED ORDER — ACETAMINOPHEN 325 MG PO TABS: 650.0000 mg | ORAL_TABLET | ORAL | Status: DC | PRN

## 2012-12-01 MED ORDER — LIDOCAINE HCL (PF) 1 % IJ SOLN
30.0000 mL | INTRAMUSCULAR | Status: DC | PRN
  Filled 2012-12-01 (×2): qty 30

## 2012-12-01 MED ORDER — PHENYLEPHRINE 40 MCG/ML (10ML) SYRINGE FOR IV PUSH (FOR BLOOD PRESSURE SUPPORT)
80.0000 ug | PREFILLED_SYRINGE | INTRAVENOUS | Status: DC | PRN
  Filled 2012-12-01: qty 2

## 2012-12-01 MED ORDER — LACTATED RINGERS IV SOLN: INTRAVENOUS | Status: DC

## 2012-12-01 NOTE — Progress Notes (Signed)
Obstetrics Brief Update Note  Called by RN. Pt's foley bulb fell out and pt complaining of increased pressure. Cervical exam below. Contacted pt's PCP, Dr. Ermalinda Memos, who will come in to manage patient for [facility] continuity delivery.  Objective: BP 104/62  Pulse 70  Temp(Src) 98.5 F (36.9 C) (Oral)  Resp 18  Ht 4' 10.5" (1.486 m)  Wt 154 lb (69.854 kg)  BMI 31.63 kg/m2  LMP 03/09/2012 FHR Tracing: rate 130-140, mod variability, accels present, few variable decels Toco: regular, every 3-5 minutes Dilation: 7 Effacement (%): 70 Cervical Position: Posterior Station: -2 Presentation: Vertex Exam by:: a. white rn  Bobbye Morton, MD PGY-2, [facility] 12/01/2012, 11:04 PM

## 2012-12-01 NOTE — Anesthesia Preprocedure Evaluation (Signed)
Anesthesia Evaluation  Patient identified by MRN, date of birth, ID band Patient awake    Reviewed: Allergy & Precautions, H&P , NPO status , Patient's Chart, lab work & pertinent test results  Airway Mallampati: II TM Distance: >3 FB Neck ROM: full    Dental no notable dental hx.    Pulmonary    Pulmonary exam normal       Cardiovascular negative cardio ROS      Neuro/Psych    GI/Hepatic negative GI ROS, Neg liver ROS,   Endo/Other  negative endocrine ROS  Renal/GU negative Renal ROS  negative genitourinary   Musculoskeletal negative musculoskeletal ROS (+)   Abdominal Normal abdominal exam  (+)   Peds  Hematology negative hematology ROS (+)   Anesthesia Other Findings   Reproductive/Obstetrics (+) Pregnancy                           Anesthesia Physical Anesthesia Plan  ASA: II  Anesthesia Plan: Epidural   Post-op Pain Management:    Induction:   Airway Management Planned:   Additional Equipment:   Intra-op Plan:   Post-operative Plan:   Informed Consent: I have reviewed the patients History and Physical, chart, labs and discussed the procedure including the risks, benefits and alternatives for the proposed anesthesia with the patient or authorized representative who has indicated his/her understanding and acceptance.     Plan Discussed with:   Anesthesia Plan Comments:         Anesthesia Quick Evaluation

## 2012-12-01 NOTE — Patient Instructions (Signed)
[facility]  We have evidence to think you have ruptured your membranes.

## 2012-12-01 NOTE — Addendum Note (Signed)
Addended by: [place], Garett Tetzloff on: 12/01/2012 03:48 PM   Modules accepted: Orders

## 2012-12-01 NOTE — Anesthesia Procedure Notes (Signed)
Epidural Patient location during procedure: OB Start time: 12/01/2012 11:25 PM End time: 12/01/2012 11:29 PM  Staffing Anesthesiologist: Leilani Able Performed by: anesthesiologist   Preanesthetic Checklist Completed: patient identified, surgical consent, pre-op evaluation, timeout performed, IV checked, risks and benefits discussed and monitors and equipment checked  Epidural Patient position: sitting Prep: site prepped and draped and DuraPrep Patient monitoring: continuous pulse ox and blood pressure Approach: midline Injection technique: LOR air  Needle:  Needle type: Tuohy  Needle gauge: 17 G Needle length: 9 cm and 9 Needle insertion depth: 6 cm Catheter type: closed end flexible Catheter size: 19 Gauge Catheter at skin depth: 11 cm Test dose: negative and Other  Assessment Sensory level: T9 Events: blood not aspirated, injection not painful, no injection resistance, negative IV test and no paresthesia  Additional Notes Reason for block:procedure for pain

## 2012-12-01 NOTE — MAU Note (Signed)
Patient states she has been having a discharge for about one week, was seen at [facility] today and was told her water had broken and she was 3 cm and told to come to MAU. Dr. Reola Calkins notified and patient is to come to MAU for evaluation. Patient states she is having some mild contractions that do not hurt and reports good fetal movement.

## 2012-12-01 NOTE — Progress Notes (Signed)
LEYANNA BITTMAN is a 27 y.o. G2P1001 at [redacted]w[redacted]d admitted for rupture of membranes  Subjective:  Pt doing well. Starting to be uncomfortable with contractions. +FM.   Objective: BP 107/59  Pulse 84  Temp(Src) 98.5 F (36.9 C) (Oral)  Resp 18  Ht 4' 10.5" (1.486 m)  Wt 69.854 kg (154 lb)  BMI 31.63 kg/m2  LMP 03/09/2012      FHT:  FHR: 130 bpm, variability: moderate,  accelerations:  Present,  decelerations:  Absent UC:   regular, every 2-4 minutes SVE:   Dilation: 3 Effacement (%): 70 Station: -2 Exam by:: Dr Reola Calkins  Labs: Lab Results  Component Value Date   WBC 8.3 12/01/2012   HGB 12.3 12/01/2012   HCT 35.0* 12/01/2012   MCV 86.2 12/01/2012   PLT 221 12/01/2012    Assessment / Plan: augmentation of labor.   Labor: on low dose pitocin and with FB in place. cont to monitor. Fetal Wellbeing:  Category I Pain Control:  Labor support without medications I/D:  n/a- GBS neg on PCR Anticipated MOD:  NSVD  Constantina Laseter L 12/01/2012, 8:46 PM

## 2012-12-01 NOTE — Progress Notes (Signed)
Megan Buchanan is a 27 y.o. G2P1001  at [redacted]w[redacted]d for routine follow up.  She reports moderate contractions every 20 minutes today, loss of mucus plug 1 week ago, intermittent vaginal spotting for 2 days,  And 7 days of leaking of fluid. She denies recent sexual contact (months ago), fever, and severe abd pain.   See flow sheet for details.  A/P: Pregnancy at [redacted]w[redacted]d.  Doing well.   Pregnancy issues include Low lying placenta, repeat US shows it has resolved also VBAC after single CS, Have attempted multiple times for consent, she no showed for her last appt to Dr. Shawnie Pons.   GBS collected today, (missed 2 weeks ago and so never got scheduled for weekly appt) Fern + and nitrizine positive today, some congealed pooling apparent on exam, sent to MAU, discussed case with intern on call.  She will likely need to be induced/augmented given she may have been ruptured for several days.  Will plan to deliver tonight or tomorrow pending progression of labor.

## 2012-12-01 NOTE — H&P (Signed)
Subjective:  Megan Buchanan is a 27 y.o. G2 P1 female at 72 and 1/[redacted] weeks gestation who is being admitted for induction of labor for prolonged ROM. She reports a mucus-like discharge 7 days ago followed by intermittent fluid leak, with intermittent pink-red spotting. She has not had abdominal pain or changes in fetal movement. Contractions are irregular and less frequent than every 10 minutes. She has not had fever, chest pain, shortness of breath, or RUQ/epigastric tenderness.  She desires to [facility] after single cesarean section, but missed the pre-natal appointment to sign consent for this and her GBS status is unknown. She has a low-lying placenta on early U/S which resolved on later imaging.   She was sent from [facility], Dr. Ermalinda Memos, after presenting with the above symptoms and being found to be fern and nitrazine positive  Objective:   Vital signs in last 24 hours: Temp:  [97.9 F (36.6 C)-98.1 F (36.7 C)] 97.9 F (36.6 C) (11/17 1558) Pulse Rate:  [87] 87 (11/17 1558) BP: (113-117)/(76-77) 113/76 mmHg (11/17 1558) Weight:  [154 lb (69.854 kg)-155 lb (70.308 kg)] 154 lb (69.854 kg) (11/17 1559)   General:   alert, cooperative and no distress  Skin:   normal  HEENT:  PERRLA and sclera clear, anicteric  Lungs:   clear to auscultation bilaterally  Heart:   regular rate and rhythm, S1, S2 normal, no murmur, click, rub or gallop  Breasts:     Abdomen:  soft, non-tender; bowel sounds normal; no masses,  no organomegaly  Pelvis:  /  FHT:  / BPM  Uterine Size: /  Presentations: /  Cervix:    Dilation: /   Effacement:    Station:  /   Consistency: /   Position: /   Lab Review  O, Rh+, Rubella-immune, Hepatitis B surface antigen non-reactive  ZOX:WRUEAVW declined  One hour GTT: 3rd trimester: 129   Assessment/Plan:  38 and 1/[redacted] weeks gestation. Inducing labor now Obstetrical history significant for prior c-section, prolonged ROM.  GBS collected at  [facility], Pitocin    Risks, benefits, alternatives and possible complications have been discussed in detail with the patient.  Pre-admission, admission, and post admission procedures and expectations were discussed in detail.  All questions answered, all appropriate consents will be signed at the Hospital. Admission is planned for today.  Megan Junker, MD  I have seen and examined this patient and agree with above documentation in the resident's note. G2P1001 @ 38.1 weeks here for PROM sometime in the last 6 days.  Admit to L&D. Routine orders. Collect GBS pcr. No abx currently. Augment with pitocin. EFW 6.5#. Cat I tracing.   Risk of uterine rupture at term is 0.78 percent with TOLAC and 0.22 percent with ERCD. 1 in 10 uterine ruptures will result in neonatal death or neurological injury. The benefits of a trial of labor after cesarean (TOLAC) resulting in a vaginal birth after cesarean (VBAC) include the following: shorter length of hospital stay and postpartum recovery (in most cases); fewer complications, such as postpartum fever, wound or uterine infection, thromboembolism (blood clots in the leg or lung), need for blood transfusion and fewer neonatal breathing problems. The risks of an attempted VBAC or TOLAC include the following: Risk of failed trial of labor after cesarean (TOLAC) without a vaginal birth after cesarean (VBAC) resulting in repeat cesarean delivery (RCD) in about 20 to 40 percent of women who attempt VBAC.  Risk of rupture of uterus resulting in  an emergency cesarean delivery. The risk of uterine rupture may be related in part to the type of uterine incision made during the first cesarean delivery. A previous transverse uterine incision has the lowest risk of rupture (0.2 to 1.5 percent risk). Vertical or T-shaped uterine incisions have a higher risk of uterine rupture (4 to 9 percent risk)The risk of fetal death is very low with both VBAC and elective repeat cesarean  delivery (ERCD), but the likelihood of fetal death is higher with VBAC than with ERCD. Maternal death is very rare with either type of delivery. The risks of an elective repeat cesarean delivery (ERCD) were reviewed with the patient including but not limited to: 02/998 risk of uterine rupture which could have serious consequences, bleeding which may require transfusion; infection which may require antibiotics; injury to bowel, bladder or other surrounding organs (bowel, bladder, ureters); injury to the fetus; need for additional procedures including hysterectomy in the event of a life-threatening hemorrhage; thromboembolic phenomenon; abnormal placentation; incisional problems; death and other postoperative or anesthesia complications.    These risks and benefits are summarized on the consent form, which was reviewed with the patient during the visit.  All her questions answered and she signed a consent indicating a preference for TOLAC. A copy of the consent was given to the patient.  Megan Buchanan, M.D. [facility] Fellow 12/01/2012 6:41 PM

## 2012-12-01 NOTE — H&P (Signed)
Chart reviewed and agree with management and plan.  

## 2012-12-02 ENCOUNTER — Encounter (HOSPITAL_COMMUNITY): Payer: Self-pay | Admitting: *Deleted

## 2012-12-02 DIAGNOSIS — O4290 Premature rupture of membranes, unspecified as to length of time between rupture and onset of labor, unspecified weeks of gestation: Secondary | ICD-10-CM

## 2012-12-02 DIAGNOSIS — O34219 Maternal care for unspecified type scar from previous cesarean delivery: Secondary | ICD-10-CM

## 2012-12-02 MED ORDER — LANOLIN HYDROUS EX OINT: TOPICAL_OINTMENT | CUTANEOUS | Status: DC | PRN

## 2012-12-02 MED ORDER — SENNOSIDES-DOCUSATE SODIUM 8.6-50 MG PO TABS
2.0000 | ORAL_TABLET | ORAL | Status: DC
  Administered 2012-12-03: 2 via ORAL
  Filled 2012-12-02: qty 2

## 2012-12-02 MED ORDER — ZOLPIDEM TARTRATE 5 MG PO TABS: 5.0000 mg | ORAL_TABLET | Freq: Every evening | ORAL | Status: DC | PRN

## 2012-12-02 MED ORDER — DIBUCAINE 1 % RE OINT: 1.0000 "application " | TOPICAL_OINTMENT | RECTAL | Status: DC | PRN

## 2012-12-02 MED ORDER — WITCH HAZEL-GLYCERIN EX PADS: 1.0000 "application " | MEDICATED_PAD | CUTANEOUS | Status: DC | PRN

## 2012-12-02 MED ORDER — OXYCODONE-ACETAMINOPHEN 5-325 MG PO TABS
1.0000 | ORAL_TABLET | ORAL | Status: DC | PRN
  Administered 2012-12-02 (×2): 1 via ORAL
  Filled 2012-12-02: qty 1

## 2012-12-02 MED ORDER — ONDANSETRON HCL 4 MG PO TABS: 4.0000 mg | ORAL_TABLET | ORAL | Status: DC | PRN

## 2012-12-02 MED ORDER — IBUPROFEN 600 MG PO TABS
600.0000 mg | ORAL_TABLET | Freq: Four times a day (QID) | ORAL | Status: DC
  Administered 2012-12-02 – 2012-12-03 (×4): 600 mg via ORAL
  Filled 2012-12-02 (×4): qty 1

## 2012-12-02 MED ORDER — BENZOCAINE-MENTHOL 20-0.5 % EX AERO
1.0000 "application " | INHALATION_SPRAY | CUTANEOUS | Status: DC | PRN
  Administered 2012-12-02: 1 via TOPICAL
  Filled 2012-12-02: qty 56

## 2012-12-02 MED ORDER — SIMETHICONE 80 MG PO CHEW
80.0000 mg | CHEWABLE_TABLET | ORAL | Status: DC | PRN
  Administered 2012-12-03: 80 mg via ORAL
  Filled 2012-12-02: qty 1

## 2012-12-02 MED ORDER — DIPHENHYDRAMINE HCL 25 MG PO CAPS: 25.0000 mg | ORAL_CAPSULE | Freq: Four times a day (QID) | ORAL | Status: DC | PRN

## 2012-12-02 MED ORDER — ONDANSETRON HCL 4 MG/2ML IJ SOLN: 4.0000 mg | INTRAMUSCULAR | Status: DC | PRN

## 2012-12-02 MED ORDER — TETANUS-DIPHTH-ACELL PERTUSSIS 5-2.5-18.5 LF-MCG/0.5 IM SUSP: 0.5000 mL | Freq: Once | INTRAMUSCULAR | Status: DC

## 2012-12-02 MED ORDER — INFLUENZA VAC SPLIT QUAD 0.5 ML IM SUSP
0.5000 mL | INTRAMUSCULAR | Status: AC
  Administered 2012-12-03: 0.5 mL via INTRAMUSCULAR
  Filled 2012-12-02: qty 0.5

## 2012-12-02 MED ORDER — PRENATAL MULTIVITAMIN CH
1.0000 | ORAL_TABLET | Freq: Every day | ORAL | Status: DC
  Administered 2012-12-02: 1 via ORAL
  Filled 2012-12-02: qty 1

## 2012-12-02 NOTE — Progress Notes (Signed)
Megan Buchanan is a 27 y.o. G2P1001 at 104w2d by LMP admitted for rupture of membranes  Subjective: Feeling much better now with epidural in place.   Objective: BP 109/66  Pulse 92  Temp(Src) 98 F (36.7 C) (Oral)  Resp 20  Ht 4' 10.5" (1.486 m)  Wt 154 lb (69.854 kg)  BMI 31.63 kg/m2  SpO2 99%  LMP 03/09/2012      FHT:  FHR: 120 bpm, variability: moderate,  accelerations:  Abscent,  decelerations:  Present Variable UC:   regular, every 2 minutes SVE:   Dilation: 7 Effacement (%): 70 Station: -2 Exam by:: a. white rn  Labs: Lab Results  Component Value Date   WBC 8.3 12/01/2012   HGB 12.3 12/01/2012   HCT 35.0* 12/01/2012   MCV 86.2 12/01/2012   PLT 221 12/01/2012    Assessment / Plan: Spontaneous labor, progressing normally, TOLAC  Labor: Progressing normally on Pitocin Preeclampsia:  no signs or symptoms of toxicity Fetal Wellbeing:  Category II Pain Control:  Epidural I/D:  n/a Anticipated MOD:  NSVD  Murtis Sink, MD [facility] Family Medicine Resident, PGY-2 12/02/2012, 12:08 AM

## 2012-12-02 NOTE — Progress Notes (Signed)

## 2012-12-02 NOTE — Progress Notes (Signed)
UR completed 

## 2012-12-02 NOTE — Anesthesia Postprocedure Evaluation (Signed)
  Anesthesia Post-op Note  Patient: Megan Buchanan  Procedure(s) Performed: * No procedures listed *  Patient Location: Mother/Baby  Anesthesia Type:Epidural  Level of Consciousness: awake, alert , oriented and patient cooperative  Airway and Oxygen Therapy: Patient Spontanous Breathing  Post-op Pain: mild  Post-op Assessment: Patient's Cardiovascular Status Stable, Respiratory Function Stable, Patent Airway, No signs of Nausea or vomiting, Adequate PO intake and Pain level controlled  Post-op Vital Signs: stable  Complications: No apparent anesthesia complications

## 2012-12-03 LAB — STREP B DNA PROBE: GBSP: NEGATIVE

## 2012-12-03 MED ORDER — IBUPROFEN 600 MG PO TABS: 600.0000 mg | ORAL_TABLET | Freq: Four times a day (QID) | ORAL | Status: DC

## 2012-12-03 MED ORDER — SENNOSIDES-DOCUSATE SODIUM 8.6-50 MG PO TABS: 2.0000 | ORAL_TABLET | ORAL | Status: DC

## 2012-12-03 NOTE — Discharge Summary (Signed)
  Obstetric Discharge Summary Reason for Admission: rupture of membranes Prenatal Procedures: none Intrapartum Procedures: spontaneous vaginal delivery Postpartum Procedures: none Complications-Operative and Postpartum: first degree perineal laceration Hemoglobin  Date Value Range Status  12/01/2012 12.3  12.0 - 15.0 g/dL Final     HCT  Date Value Range Status  12/01/2012 35.0* 36.0 - 46.0 % Final    Physical Exam:  General: alert, cooperative and appears stated age Lochia: appropriate Uterine Fundus: firm Incision: N/A DVT Evaluation: No evidence of DVT seen on physical exam. Negative Homan's sign. No cords or calf tenderness.  Discharge Diagnoses: Term Pregnancy-delivered  Discharge Information: Date: 12/03/2012 Activity: pelvic rest Diet: routine Medications: Ibuprofen and sennakot Condition: stable Instructions: refer to practice specific booklet Discharge to: home Follow-up Information   Follow up with Kevin Fenton, MD. (Call for postpartum appointment in 4-6 weeks, call for seperate appointnment for nexplanon insertion in 2-4 weeks. )    Specialty:  Family Medicine   Contact information:   [address] [place] [address] [phone]       Newborn Data: Live born female  Birth Weight: 6 lb 14.8 oz (3140 g) APGAR: 2, 6  Home with mother.  Kevin Fenton 12/03/2012, 6:35 AM  I have seen this patient and agree with the above resident's note.  LEFTWICH-KIRBY, Kamaiyah Uselton Certified Nurse-Midwife

## 2012-12-05 ENCOUNTER — Encounter (HOSPITAL_COMMUNITY): Payer: Self-pay | Admitting: *Deleted

## 2012-12-05 ENCOUNTER — Inpatient Hospital Stay (HOSPITAL_COMMUNITY)
Admission: AD | Admit: 2012-12-05 | Discharge: 2012-12-05 | Disposition: A | Discharge status: Home / Self Care | Payer: Medicaid Other | Source: Ambulatory Visit | Attending: Obstetrics & Gynecology | Admitting: Obstetrics & Gynecology

## 2012-12-05 DIAGNOSIS — O9279 Other disorders of lactation: Secondary | ICD-10-CM

## 2012-12-05 DIAGNOSIS — K5900 Constipation, unspecified: Secondary | ICD-10-CM | POA: Insufficient documentation

## 2012-12-05 DIAGNOSIS — N644 Mastodynia: Secondary | ICD-10-CM | POA: Insufficient documentation

## 2012-12-05 MED ORDER — DOCUSATE SODIUM 100 MG PO CAPS: 100.0000 mg | ORAL_CAPSULE | Freq: Two times a day (BID) | ORAL | Status: DC | PRN

## 2012-12-05 MED ORDER — IBUPROFEN 600 MG PO TABS: 600.0000 mg | ORAL_TABLET | Freq: Four times a day (QID) | ORAL | Status: DC | PRN

## 2012-12-05 MED ORDER — POLYETHYLENE GLYCOL 3350 17 G PO PACK: 17.0000 g | PACK | Freq: Every day | ORAL | Status: DC

## 2012-12-05 MED ORDER — IBUPROFEN 800 MG PO TABS
800.0000 mg | ORAL_TABLET | ORAL | Status: AC
  Administered 2012-12-05: 800 mg via ORAL
  Filled 2012-12-05: qty 1

## 2012-12-05 NOTE — MAU Provider Note (Signed)
History     CSN: [id number]  Arrival date and time: 12/05/12 1478   First Provider Initiated Contact with Patient 12/05/12 0911      Chief Complaint  Patient presents with  . Breast Pain   HPI Comments: Megan Buchanan [id number] presents with complaints of breast pain.  The patient delivered on 12/01/12.  She has noticed increasing breast size and tenderness over the last 2 days.  She has been latching the baby every 2 hours to feed.  She reports that he has a good latch but is "harder" than her last child's.  2 days ago she started hand pumping after his feedings to alleviate the pressure.  She did not get sufficient relief from this so yesterday she started pumping before and after latching.  The pain and pressure has not been relieved by this.  She has been supplementing the baby with 2 oz of formula after each feeding.  She is also experiencing nipple soreness.  She has been using lanolin cream, sparingly, as directed by lactation.  She also uses breast milk on her nipples to alleviate the pain.  She took Tylenol 500 mg this morning for the pain without relief.  She currently rates the pain as a 9-10.     Past Medical History  Diagnosis Date  . Shortness of breath   . Depression     doing ok    Past Surgical History  Procedure Laterality Date  . Cesarean section    . Hysteroscopy w/d&c  01/24/2011    Procedure: DILATATION AND CURETTAGE /HYSTEROSCOPY;  Surgeon: Tereso Newcomer, MD;  Location: [facility];  Service: Gynecology;  Laterality: N/A;  Hysteroscopy only  . Iud removal  01/24/2011    Procedure: INTRAUTERINE DEVICE (IUD) REMOVAL;  Surgeon: Tereso Newcomer, MD;  Location: [facility];  Service: Gynecology;  Laterality: N/A;    Family History  Problem Relation Age of Onset  . Arthritis Mother   . Hyperlipidemia Father     History  Substance Use Topics  . Smoking status: Never Smoker   . Smokeless tobacco: Never Used  . Alcohol Use: No    Allergies: No Known  Allergies  Prescriptions prior to admission  Medication Sig Dispense Refill  . acetaminophen (TYLENOL) 500 MG tablet Take 500 mg by mouth every 6 (six) hours as needed for mild pain.      . Prenatal Vit-Fe Fumarate-FA (PRENATAL MULTIVITAMIN) TABS tablet Take 1 tablet by mouth daily at 12 noon.        Review of Systems  Constitutional: Negative.    Bilateral breast pain  Physical Exam   Blood pressure 110/82, pulse 88, temperature 98.6 F (37 C), temperature source Oral, resp. rate 16, last menstrual period 03/09/2012, unknown if currently breastfeeding.  Physical Exam  Bilateral breasts firm to touch, nipples excoriated  MAU Course  Procedures  Breast pump Lactation consultant in to speak with patient.  Discussed latching, increased feedings with less supplementation.  Ice to breasts for pain relief.  Continue to hand pump as needed.     Assessment and Plan   A:     Breast engorgement     Constipation  P:    Lactation consult while in the MAU    Pump with electric pump in MAU    Ibuprofen 800 mg orally x1    Discharge home    Ibuprofen 600 mg orally every 6 hours as needed for pain    Colace BID    Miralax daily  PRN  Howdeshell, [name] 12/05/2012, 9:28 AM   I have seen this patient and agree with the above student's note.  LEFTWICH-KIRBY, Allan Minotti Certified Nurse-Midwife

## 2012-12-05 NOTE — Consult Note (Signed)
Called to assess and assist Mom with engorgement in MAU.  Mom is a P2 with a 4 day old newborn.  She reports breasts becoming hard and painful last night.  She stopped putting baby to breast due to discomfort and unable to obtain milk using manual pump. Baby has been bottle fed formula.  She has used ice four times. On exam both breasts with moderate firmness and engorgement.  Both nipple tips with small abrasions.  Assisted with latching baby to left breast using cross cradle hold.  Baby latched easily and deep.  Mom uncomfortable the first few minutes but pain eased with milk flow.  Baby gulping and significant softening after a 15 minute feeding.  Baby came off content.  DEBP initiated and good milk flow from both breasts observed.  Instructed to continue ice and good breast emptying by baby or pump if needed every 2 hours.  Take ibuprofen for pain and swelling as directed.

## 2012-12-05 NOTE — MAU Note (Signed)
Patient states she had a vaginal delivery on 11-18. Patient states she is having a lot of breast pain, has been pumping and breast feeding but is not helping the pain. Nipples are sore.

## 2012-12-05 NOTE — MAU Note (Signed)
Lactation in with pt

## 2012-12-10 NOTE — MAU Provider Note (Signed)
Attestation of Attending Supervision of Advanced Practitioner (CNM/NP): Evaluation and management procedures were performed by the Advanced Practitioner under my supervision and collaboration.  I have reviewed the Advanced Practitioner's note and chart, and I agree with the management and plan.  HARRAWAY-SMITH, Phillis Thackeray 11:22 AM

## 2013-01-06 ENCOUNTER — Ambulatory Visit (INDEPENDENT_AMBULATORY_CARE_PROVIDER_SITE_OTHER): Payer: Medicaid Other | Admitting: Family Medicine

## 2013-01-06 ENCOUNTER — Encounter: Payer: Self-pay | Admitting: Family Medicine

## 2013-01-06 ENCOUNTER — Other Ambulatory Visit (HOSPITAL_COMMUNITY)
Admission: RE | Admit: 2013-01-06 | Discharge: 2013-01-06 | Disposition: A | Discharge status: Home / Self Care | Payer: Medicaid Other | Source: Ambulatory Visit | Attending: Family Medicine | Admitting: Family Medicine

## 2013-01-06 VITALS — BP 107/58 | HR 77 | Temp 98.2°F | Resp 18 | Wt 141.0 lb

## 2013-01-06 DIAGNOSIS — Z01419 Encounter for gynecological examination (general) (routine) without abnormal findings: Secondary | ICD-10-CM | POA: Insufficient documentation

## 2013-01-06 DIAGNOSIS — Z124 Encounter for screening for malignant neoplasm of cervix: Secondary | ICD-10-CM

## 2013-01-06 MED ORDER — NORETHINDRONE 0.35 MG PO TABS: 1.0000 | ORAL_TABLET | Freq: Every day | ORAL | Status: DC

## 2013-01-06 MED ORDER — NORGESTIMATE-ETH ESTRADIOL 0.25-35 MG-MCG PO TABS: 1.0000 | ORAL_TABLET | Freq: Every day | ORAL | Status: DC

## 2013-01-06 NOTE — Progress Notes (Signed)
Patient ID: Megan Buchanan, female   DOB: Feb 27, 1985, 27 y.o.   MRN: [id number] Subjective:    Performed with translator   Megan Buchanan is a 27 y.o. female who presents for a postpartum visit. She is 6 weeks postpartum following a spontaneous vaginal delivery. I have fully reviewed the prenatal and intrapartum course. The delivery was at38 gestational weeks. Outcome: vaginal birth after cesarean (VBAC). Anesthesia: epidural. Postpartum course has been uneventful. Baby's course has been normal. Baby is feeding by both breast and bottle - do not know. Bleeding spotting occasionally. Bowel function is normal. Bladder function is normal. Patient is not sexually active. Contraception method is OCP (estrogen/progesterone) and oral progesterone-only contraceptive. Postpartum depression screening: negative.  The following portions of the patient's history were reviewed and updated as appropriate: allergies, current medications, past family history, past medical history, past social history, past surgical history and problem list.  Review of Systems Pertinent items are noted in HPI.   Objective:    BP 107/58  Pulse 77  Temp(Src) 98.2 F (36.8 C) (Oral)  Resp 18  Wt 141 lb (63.957 kg)  SpO2 99%  General:  alert, cooperative and appears stated age   Breasts:  deferred  Lungs: clear to auscultation bilaterally  Heart:  regular rate and rhythm, S1, S2 normal, no murmur, click, rub or gallop  Abdomen: soft, non-tender; bowel sounds normal; no masses,  no organomegaly   Vulva:  normal  Vagina: normal vagina  Cervix:  no bleeding following Pap, no cervical motion tenderness and no lesions  Corpus: normal size, contour, position, consistency, mobility, non-tender  Adnexa:  normal adnexa  Rectal Exam: Not performed.        Assessment:     Normal postpartum exam. Pap smear done at today's visit.   Plan:    1. Contraception: OCP (estrogen/progesterone) and oral progesterone-only  contraceptive  Minipill for 5 months as she is planning to breast feed until 28 months of age and then swith to combined pill, both Rx written today.  2. Miralax PRN for constipation, already taking 3. Follow up in: 1 year or as needed.   Murtis Sink, MD [facility] Family Medicine Resident, PGY-2 01/06/2013, 12:50 PM

## 2013-01-06 NOTE — Patient Instructions (Addendum)
It was great to see you!  Take the micronor while you are breastfeeding. It is more time sensitive than regular birth control pills so try to take it within the same hour everyday.   Take the sprintec after you stop breastfeeding. It is safe for the baby but may slow down your milk supply.   Please call if you have any questions or need anything different.

## 2013-01-06 NOTE — Assessment & Plan Note (Signed)
Normal course and exam Rx for micronor for while she plans to breastfeed written, Also combined pill written  No other concerns, follow up as needed Wanted nexplanon but cannot afford it, had issues with mirena in the past.

## 2013-01-09 ENCOUNTER — Encounter: Payer: Self-pay | Admitting: Family Medicine

## 2013-11-16 ENCOUNTER — Encounter: Payer: Self-pay | Admitting: Family Medicine

## 2017-01-15 NOTE — L&D Delivery Note (Signed)
Patient is a 32 y.o. now G3P3 s/p NSVD at 732w4d, who was admitted for SOL, SROM@1300 .  She progressed without augmentation to complete and pushed 10 minutes to deliver.  Cord clamping delayed by several minutes then clamped by CNM and cut by FOB.  Placenta intact and spontaneous, bleeding minimal.  Mom and baby stable prior to transfer to postpartum. She plans on breastfeeding. She requests BTL for birth control- needs interval tubal.  Delivery Note At 9:45 PM a viable and healthy female was delivered via VBAC, Spontaneous (Presentation: LOA).  APGAR: 9, 9; weight 6 lb 7.5 oz (2934 g).   Placenta intact and spontaneous, bleeding minimal.  3V Cord:  with the following complications: short cord   Anesthesia:  Epidural  Episiotomy: None Lacerations: None Suture Repair: None Est. Blood Loss (mL): 100  Mom to postpartum.  Baby to Couplet care / Skin to Skin.  Sharyon CableVeronica C Maddon Horton CNM 09/13/2017, 11:22 PM

## 2017-03-14 LAB — OB RESULTS CONSOLE HEPATITIS B SURFACE ANTIGEN: HEP B S AG: NEGATIVE

## 2017-03-14 LAB — OB RESULTS CONSOLE GC/CHLAMYDIA
CHLAMYDIA, DNA PROBE: NEGATIVE
GC PROBE AMP, GENITAL: NEGATIVE

## 2017-03-14 LAB — OB RESULTS CONSOLE HIV ANTIBODY (ROUTINE TESTING): HIV: NONREACTIVE

## 2017-03-14 LAB — OB RESULTS CONSOLE RUBELLA ANTIBODY, IGM: Rubella: IMMUNE

## 2017-03-14 LAB — OB RESULTS CONSOLE RPR: RPR: NONREACTIVE

## 2017-03-15 ENCOUNTER — Encounter (HOSPITAL_COMMUNITY): Payer: Self-pay | Admitting: Family

## 2017-03-15 ENCOUNTER — Other Ambulatory Visit (HOSPITAL_COMMUNITY): Payer: Self-pay | Admitting: Family

## 2017-03-15 DIAGNOSIS — Z3682 Encounter for antenatal screening for nuchal translucency: Secondary | ICD-10-CM

## 2017-03-15 DIAGNOSIS — Z3A13 13 weeks gestation of pregnancy: Secondary | ICD-10-CM

## 2017-03-26 ENCOUNTER — Encounter (HOSPITAL_COMMUNITY): Payer: Self-pay | Admitting: Family

## 2017-03-27 ENCOUNTER — Encounter (HOSPITAL_COMMUNITY): Payer: Self-pay | Admitting: *Deleted

## 2017-03-28 ENCOUNTER — Ambulatory Visit (HOSPITAL_COMMUNITY)
Admission: RE | Admit: 2017-03-28 | Discharge: 2017-03-28 | Disposition: A | Discharge status: Home / Self Care | Payer: Self-pay | Source: Ambulatory Visit | Attending: Family | Admitting: Family

## 2017-03-28 ENCOUNTER — Other Ambulatory Visit (HOSPITAL_COMMUNITY): Payer: Self-pay | Admitting: Family

## 2017-03-28 ENCOUNTER — Ambulatory Visit (HOSPITAL_COMMUNITY): Admission: RE | Admit: 2017-03-28 | Payer: Self-pay | Source: Ambulatory Visit

## 2017-03-28 ENCOUNTER — Encounter (HOSPITAL_COMMUNITY): Payer: Self-pay

## 2017-03-28 DIAGNOSIS — Z3A14 14 weeks gestation of pregnancy: Secondary | ICD-10-CM | POA: Insufficient documentation

## 2017-03-28 DIAGNOSIS — Z3689 Encounter for other specified antenatal screening: Secondary | ICD-10-CM | POA: Insufficient documentation

## 2017-03-28 DIAGNOSIS — Z3682 Encounter for antenatal screening for nuchal translucency: Secondary | ICD-10-CM

## 2017-03-28 DIAGNOSIS — Z3A13 13 weeks gestation of pregnancy: Secondary | ICD-10-CM

## 2017-08-26 LAB — OB RESULTS CONSOLE GBS: GBS: NEGATIVE

## 2017-09-13 ENCOUNTER — Inpatient Hospital Stay (HOSPITAL_COMMUNITY): Payer: Medicaid Other | Admitting: Anesthesiology

## 2017-09-13 ENCOUNTER — Encounter (HOSPITAL_COMMUNITY): Payer: Self-pay | Admitting: *Deleted

## 2017-09-13 ENCOUNTER — Other Ambulatory Visit: Payer: Self-pay

## 2017-09-13 ENCOUNTER — Inpatient Hospital Stay (HOSPITAL_COMMUNITY)
Admission: AD | Admit: 2017-09-13 | Discharge: 2017-09-15 | DRG: 807 | Disposition: A | Discharge status: Home / Self Care | Payer: Medicaid Other | Source: Ambulatory Visit | Attending: Obstetrics & Gynecology | Admitting: Obstetrics & Gynecology

## 2017-09-13 DIAGNOSIS — O34219 Maternal care for unspecified type scar from previous cesarean delivery: Secondary | ICD-10-CM | POA: Diagnosis not present

## 2017-09-13 DIAGNOSIS — O4292 Full-term premature rupture of membranes, unspecified as to length of time between rupture and onset of labor: Secondary | ICD-10-CM | POA: Diagnosis present

## 2017-09-13 DIAGNOSIS — Z3A38 38 weeks gestation of pregnancy: Secondary | ICD-10-CM

## 2017-09-13 DIAGNOSIS — O4290 Premature rupture of membranes, unspecified as to length of time between rupture and onset of labor, unspecified weeks of gestation: Secondary | ICD-10-CM | POA: Diagnosis present

## 2017-09-13 DIAGNOSIS — Z98891 History of uterine scar from previous surgery: Secondary | ICD-10-CM

## 2017-09-13 LAB — CBC
HEMATOCRIT: 35.3 % — AB (ref 36.0–46.0)
HEMOGLOBIN: 12.4 g/dL (ref 12.0–15.0)
MCH: 31.6 pg (ref 26.0–34.0)
MCHC: 35.1 g/dL (ref 30.0–36.0)
MCV: 89.8 fL (ref 78.0–100.0)
Platelets: 246 10*3/uL (ref 150–400)
RBC: 3.93 MIL/uL (ref 3.87–5.11)
RDW: 13.4 % (ref 11.5–15.5)
WBC: 9.2 10*3/uL (ref 4.0–10.5)

## 2017-09-13 LAB — TYPE AND SCREEN
ABO/RH(D): O POS
ANTIBODY SCREEN: NEGATIVE

## 2017-09-13 LAB — POCT FERN TEST: POCT Fern Test: POSITIVE

## 2017-09-13 MED ORDER — FENTANYL CITRATE (PF) 100 MCG/2ML IJ SOLN: 100.0000 ug | INTRAMUSCULAR | Status: DC | PRN

## 2017-09-13 MED ORDER — LACTATED RINGERS IV SOLN: 500.0000 mL | INTRAVENOUS | Status: DC | PRN

## 2017-09-13 MED ORDER — OXYTOCIN BOLUS FROM INFUSION
500.0000 mL | Freq: Once | INTRAVENOUS | Status: AC
  Administered 2017-09-13: 500 mL via INTRAVENOUS

## 2017-09-13 MED ORDER — PHENYLEPHRINE 40 MCG/ML (10ML) SYRINGE FOR IV PUSH (FOR BLOOD PRESSURE SUPPORT)
80.0000 ug | PREFILLED_SYRINGE | INTRAVENOUS | Status: DC | PRN
  Filled 2017-09-13: qty 5
  Filled 2017-09-13: qty 10

## 2017-09-13 MED ORDER — FENTANYL 2.5 MCG/ML BUPIVACAINE 1/10 % EPIDURAL INFUSION (WH - ANES)
14.0000 mL/h | INTRAMUSCULAR | Status: DC | PRN
  Administered 2017-09-13: 14 mL/h via EPIDURAL
  Filled 2017-09-13: qty 100

## 2017-09-13 MED ORDER — LACTATED RINGERS IV SOLN: 500.0000 mL | Freq: Once | INTRAVENOUS | Status: DC

## 2017-09-13 MED ORDER — IBUPROFEN 600 MG PO TABS
600.0000 mg | ORAL_TABLET | Freq: Four times a day (QID) | ORAL | Status: DC
  Administered 2017-09-14 – 2017-09-15 (×7): 600 mg via ORAL
  Filled 2017-09-13 (×7): qty 1

## 2017-09-13 MED ORDER — SOD CITRATE-CITRIC ACID 500-334 MG/5ML PO SOLN: 30.0000 mL | ORAL | Status: DC | PRN

## 2017-09-13 MED ORDER — LIDOCAINE HCL (PF) 1 % IJ SOLN
INTRAMUSCULAR | Status: DC | PRN
  Administered 2017-09-13: 6 mL via EPIDURAL
  Administered 2017-09-13 (×2): 4 mL via EPIDURAL

## 2017-09-13 MED ORDER — LIDOCAINE HCL (PF) 1 % IJ SOLN
30.0000 mL | INTRAMUSCULAR | Status: DC | PRN
  Filled 2017-09-13: qty 30

## 2017-09-13 MED ORDER — DIPHENHYDRAMINE HCL 50 MG/ML IJ SOLN: 12.5000 mg | INTRAMUSCULAR | Status: DC | PRN

## 2017-09-13 MED ORDER — OXYTOCIN 40 UNITS IN LACTATED RINGERS INFUSION - SIMPLE MED
1.0000 m[IU]/min | INTRAVENOUS | Status: DC
  Filled 2017-09-13: qty 1000

## 2017-09-13 MED ORDER — EPHEDRINE 5 MG/ML INJ
10.0000 mg | INTRAVENOUS | Status: DC | PRN
  Filled 2017-09-13: qty 2

## 2017-09-13 MED ORDER — FLEET ENEMA 7-19 GM/118ML RE ENEM: 1.0000 | ENEMA | RECTAL | Status: DC | PRN

## 2017-09-13 MED ORDER — TERBUTALINE SULFATE 1 MG/ML IJ SOLN
0.2500 mg | Freq: Once | INTRAMUSCULAR | Status: DC | PRN
  Filled 2017-09-13: qty 1

## 2017-09-13 MED ORDER — LACTATED RINGERS IV SOLN
INTRAVENOUS | Status: DC
  Administered 2017-09-13 (×2): via INTRAVENOUS

## 2017-09-13 MED ORDER — PHENYLEPHRINE 40 MCG/ML (10ML) SYRINGE FOR IV PUSH (FOR BLOOD PRESSURE SUPPORT)
80.0000 ug | PREFILLED_SYRINGE | INTRAVENOUS | Status: DC | PRN
  Filled 2017-09-13: qty 5

## 2017-09-13 MED ORDER — OXYTOCIN 40 UNITS IN LACTATED RINGERS INFUSION - SIMPLE MED: 2.5000 [IU]/h | INTRAVENOUS | Status: DC

## 2017-09-13 MED ORDER — ONDANSETRON HCL 4 MG/2ML IJ SOLN: 4.0000 mg | Freq: Four times a day (QID) | INTRAMUSCULAR | Status: DC | PRN

## 2017-09-13 NOTE — Anesthesia Pain Management Evaluation Note (Signed)
  CRNA Pain Management Visit Note  Patient: Megan Buchanan, 32 y.o., female  "Hello I am a member of the anesthesia team at [facility]. We have an anesthesia team available at all times to provide care throughout the hospital, including epidural management and anesthesia for C-section. I don't know your plan for the delivery whether it a natural birth, water birth, IV sedation, nitrous supplementation, doula or epidural, but we want to meet your pain goals."   1.Was your pain managed to your expectations on prior hospitalizations?   Yes   2.What is your expectation for pain management during this hospitalization?     Epidural  3.How can we help you reach that goal? epidural  Record the patient's initial score and the patient's pain goal.   Pain: 0  Pain Goal: 4 [facility] wants you to be able to say your pain was always managed very well.  Jandi Swiger 09/13/2017

## 2017-09-13 NOTE — Anesthesia Procedure Notes (Signed)
Epidural Patient location during procedure: OB Start time: 09/13/2017 4:50 PM End time: 09/13/2017 5:05 PM  Staffing Anesthesiologist: Elmer PickerWoodrum, Kemontae Dunklee L, MD Performed: anesthesiologist   Preanesthetic Checklist Completed: patient identified, pre-op evaluation, timeout performed, IV checked, risks and benefits discussed and monitors and equipment checked  Epidural Patient position: sitting Prep: site prepped and draped and DuraPrep Patient monitoring: continuous pulse ox, blood pressure, heart rate and cardiac monitor Approach: midline Location: L3-L4 Injection technique: LOR air  Needle:  Needle type: Tuohy  Needle gauge: 17 G Needle length: 9 cm Needle insertion depth: 4 cm Catheter type: closed end flexible Catheter size: 19 Gauge Catheter at skin depth: 9 cm Test dose: negative  Assessment Sensory level: T8 Events: blood not aspirated, injection not painful, no injection resistance, negative IV test and no paresthesia  Additional Notes Reason for block:procedure for pain

## 2017-09-13 NOTE — H&P (Addendum)
OBSTETRIC ADMISSION HISTORY AND PHYSICAL  Megan Buchanan is a 32 y.o. female G3P2002 with IUP at [redacted]w[redacted]d by early [place] presenting in labor after SROM at approximately 1300. Patient reports noticing "leaking" this morning between 0100 and 0400. She reports 1-2 contractions an hour this morning but started having strong regular contractions while in the MAU about an hour ago.Patient reports c-section with her first pregnancy and a successful vaginal delivery for her second child. UTI this pregnancy.  Denies HA, changes in vision, NV. Unable to complete remaining ROS due to active labor.  Reports fetal movement. Denies vaginal bleeding.   She received her prenatal care at [facility].  Support person in labor: FOB  Ultrasounds . Anatomy U/S: 03/28/2017 Impression  SIUP at 14+3 weeks  No gross abnormalities identified  Markers of aneuploidy: none  Normal amniotic fluid volume  EDC based on today's measurements: 09/23/17  Ms. Buchanan declined further aneuploidy screening.  Prenatal History/Complications: . None  Past Medical History: Past Medical History:  Diagnosis Date  . Depression    doing ok  . Shortness of breath     Past Surgical History: Past Surgical History:  Procedure Laterality Date  . CESAREAN SECTION    . HYSTEROSCOPY W/D&C  01/24/2011   Procedure: DILATATION AND CURETTAGE /HYSTEROSCOPY;  Surgeon: Tereso Newcomer, MD;  Location: [facility];  Service: Gynecology;  Laterality: N/A;  Hysteroscopy only  . IUD REMOVAL  01/24/2011   Procedure: INTRAUTERINE DEVICE (IUD) REMOVAL;  Surgeon: Tereso Newcomer, MD;  Location: [facility];  Service: Gynecology;  Laterality: N/A;    Obstetrical History: OB History    Gravida  3   Para  2   Term  2   Preterm      AB      Living  2     SAB      TAB      Ectopic      Multiple      Live Births  2           Social History: Social History   Socioeconomic History  . Marital status: Married    Spouse name: Not on  file  . Number of children: Not on file  . Years of education: Not on file  . Highest education level: Not on file  Occupational History  . Not on file  Social Needs  . Financial resource strain: Not on file  . Food insecurity:    Worry: Not on file    Inability: Not on file  . Transportation needs:    Medical: Not on file    Non-medical: Not on file  Tobacco Use  . Smoking status: Never Smoker  . Smokeless tobacco: Never Used  Substance and Sexual Activity  . Alcohol use: No  . Drug use: No  . Sexual activity: Yes  Lifestyle  . Physical activity:    Days per week: Not on file    Minutes per session: Not on file  . Stress: Not on file  Relationships  . Social connections:    Talks on phone: Not on file    Gets together: Not on file    Attends religious service: Not on file    Active member of club or organization: Not on file    Attends meetings of clubs or organizations: Not on file    Relationship status: Not on file  Other Topics Concern  . Not on file  Social History Narrative  . Not on file  Family History: Family History  Problem Relation Age of Onset  . Arthritis Mother   . Hyperlipidemia Father     Allergies: No Known Allergies  Medications Prior to Admission  Medication Sig Dispense Refill Last Dose  . Prenatal Vit-Fe Fumarate-FA (PRENATAL MULTIVITAMIN) TABS tablet Take 1 tablet by mouth daily at 12 noon.   Taking     Review of Systems  All systems reviewed and negative except as stated in HPI  Blood pressure 113/70, pulse 90, temperature 98 F (36.7 C), temperature source Axillary, resp. rate 18, height 4\' 10"  (1.473 m), weight 72.5 kg, last menstrual period 12/26/2016, SpO2 99 %. General appearance: alert, cooperative, severe distress and having strong cotractions every 2-3 min  Lungs: no respiratory distress Heart: regular rate  Abdomen: soft, non-tender; gravid b Pelvic: Deferred  Extremities: Homans sign is negative, no sign of  DVT Presentation: cephalic Fetal monitoring: category I strip  baseline 150s; high variability, accelerations, no decelerations  Uterine activity: q2-3 min  Dilation: 7 Effacement (%): 90 Station: -1 Exam by:: Gifford ShaveYancey Luft RN   Prenatal labs: ABO, Rh: --/--/O POS (08/30 1541)O POS  Antibody: NEG (08/30 1541)Neg  Rubella:  Neg  RPR:   Neg  HBsAg:   Neg  HIV:   Nonreactive  GBS:   Negative  Glucola: Normal  Genetic screening:  Unsure   Prenatal Transfer Tool  Maternal Diabetes: No Maternal Ultrasounds/Referrals: Normal Fetal Ultrasounds or other Referrals:  None Maternal Substance Abuse:  No Significant Maternal Medications:  None Significant Maternal Lab Results: None  Results for orders placed or performed during the hospital encounter of 09/13/17 (from the past 24 hour(s))  POCT fern test   Collection Time: 09/13/17  3:13 PM  Result Value Ref Range   POCT Fern Test Positive = ruptured amniotic membanes   CBC   Collection Time: 09/13/17  3:41 PM  Result Value Ref Range   WBC 9.2 4.0 - 10.5 K/uL   RBC 3.93 3.87 - 5.11 MIL/uL   Hemoglobin 12.4 12.0 - 15.0 g/dL   HCT 16.135.3 (L) 09.636.0 - 04.546.0 %   MCV 89.8 78.0 - 100.0 fL   MCH 31.6 26.0 - 34.0 pg   MCHC 35.1 30.0 - 36.0 g/dL   RDW 40.913.4 81.111.5 - 91.415.5 %   Platelets 246 150 - 400 K/uL  Type and screen [facility]   Collection Time: 09/13/17  3:41 PM  Result Value Ref Range   ABO/RH(D) O POS    Antibody Screen NEG    Sample Expiration      09/16/2017 Performed at [facility], [address] [place], [place] [address]     Patient Active Problem List   Diagnosis Date Noted  . PROM (premature rupture of membranes) 09/13/2017  . Routine postpartum follow-up 01/06/2013  . Joint stiffness 11/13/2012  . Carpal tunnel syndrome 10/27/2012  . Rectal bleeding 10/27/2012  . Dysuria 09/10/2012  . Supervision of other normal pregnancy 06/18/2012    Assessment/Plan:  Megan Buchanan is a 32 y.o.  G3P2002 at 3851w4d here for PROM  Labor: Progressing quickly, expectant management of active labor -- pain control: epidural   Fetal Wellbeing: Cephalic by sutures .  -- GBS (neg) -- continuous fetal monitoring - cat 1    Postpartum Planning -- breast/BTL  Charna ElizabethJohn Victor Cresenzo, MS4 [employer]   Attestation: I have seen this patient and agree with the medical student's documentation. I have examined them separately, and we have discussed the  plan of care.   Cristal Deer. Earlene Plater, DO OB/GYN Fellow

## 2017-09-13 NOTE — Anesthesia Preprocedure Evaluation (Signed)
Anesthesia Evaluation  Patient identified by MRN, date of birth, ID band Patient awake    Reviewed: Allergy & Precautions, NPO status , Patient's Chart, lab work & pertinent test results  Airway Mallampati: II  TM Distance: >3 FB Neck ROM: Full    Dental no notable dental hx. (+) Teeth Intact   Pulmonary neg pulmonary ROS,    Pulmonary exam normal breath sounds clear to auscultation       Cardiovascular negative cardio ROS Normal cardiovascular exam Rhythm:Regular Rate:Normal     Neuro/Psych Depression negative neurological ROS  negative psych ROS   GI/Hepatic negative GI ROS, Neg liver ROS,   Endo/Other  negative endocrine ROS  Renal/GU negative Renal ROS  negative genitourinary   Musculoskeletal negative musculoskeletal ROS (+)   Abdominal   Peds  Hematology negative hematology ROS (+)   Anesthesia Other Findings   Reproductive/Obstetrics (+) Pregnancy                             Anesthesia Physical Anesthesia Plan  ASA: II  Anesthesia Plan: Epidural   Post-op Pain Management:    Induction:   PONV Risk Score and Plan: Treatment may vary due to age or medical condition  Airway Management Planned: Natural Airway  Additional Equipment:   Intra-op Plan:   Post-operative Plan:   Informed Consent: I have reviewed the patients History and Physical, chart, labs and discussed the procedure including the risks, benefits and alternatives for the proposed anesthesia with the patient or authorized representative who has indicated his/her understanding and acceptance.     Plan Discussed with: Anesthesiologist  Anesthesia Plan Comments: (Patient identified. Risks, benefits, options discussed with patient including but not limited to bleeding, infection, nerve damage, paralysis, failed block, incomplete pain control, headache, blood pressure changes, nausea, vomiting, reactions to  medication, itching, and post partum back pain. Confirmed with bedside nurse the patient's most recent platelet count. Confirmed with the patient that they are not taking any anticoagulation, have any bleeding history or any family history of bleeding disorders. Patient expressed understanding and wishes to proceed. All questions were answered. )        Anesthesia Quick Evaluation

## 2017-09-13 NOTE — MAU Note (Signed)
Water broke, around 1300, clear fluid- still coming.  Some little pains in low back, started after fluid. Was 3 cm on Monday

## 2017-09-14 ENCOUNTER — Encounter (HOSPITAL_COMMUNITY): Payer: Self-pay

## 2017-09-14 LAB — RPR: RPR: NONREACTIVE

## 2017-09-14 MED ORDER — TETANUS-DIPHTH-ACELL PERTUSSIS 5-2.5-18.5 LF-MCG/0.5 IM SUSP: 0.5000 mL | Freq: Once | INTRAMUSCULAR | Status: DC

## 2017-09-14 MED ORDER — ZOLPIDEM TARTRATE 5 MG PO TABS: 5.0000 mg | ORAL_TABLET | Freq: Every evening | ORAL | Status: DC | PRN

## 2017-09-14 MED ORDER — SIMETHICONE 80 MG PO CHEW
80.0000 mg | CHEWABLE_TABLET | ORAL | Status: DC | PRN
  Administered 2017-09-14: 80 mg via ORAL
  Filled 2017-09-14: qty 1

## 2017-09-14 MED ORDER — ACETAMINOPHEN 325 MG PO TABS
650.0000 mg | ORAL_TABLET | ORAL | Status: DC | PRN
  Administered 2017-09-14 (×2): 650 mg via ORAL
  Filled 2017-09-14 (×2): qty 2

## 2017-09-14 MED ORDER — PRENATAL MULTIVITAMIN CH
1.0000 | ORAL_TABLET | Freq: Every day | ORAL | Status: DC
  Administered 2017-09-14 – 2017-09-15 (×2): 1 via ORAL
  Filled 2017-09-14 (×2): qty 1

## 2017-09-14 MED ORDER — ONDANSETRON HCL 4 MG/2ML IJ SOLN: 4.0000 mg | INTRAMUSCULAR | Status: DC | PRN

## 2017-09-14 MED ORDER — BENZOCAINE-MENTHOL 20-0.5 % EX AERO: 1.0000 "application " | INHALATION_SPRAY | CUTANEOUS | Status: DC | PRN

## 2017-09-14 MED ORDER — DIPHENHYDRAMINE HCL 25 MG PO CAPS: 25.0000 mg | ORAL_CAPSULE | Freq: Four times a day (QID) | ORAL | Status: DC | PRN

## 2017-09-14 MED ORDER — ONDANSETRON HCL 4 MG PO TABS: 4.0000 mg | ORAL_TABLET | ORAL | Status: DC | PRN

## 2017-09-14 MED ORDER — SENNOSIDES-DOCUSATE SODIUM 8.6-50 MG PO TABS
2.0000 | ORAL_TABLET | ORAL | Status: DC
  Administered 2017-09-14 – 2017-09-15 (×2): 2 via ORAL
  Filled 2017-09-14 (×2): qty 2

## 2017-09-14 MED ORDER — WITCH HAZEL-GLYCERIN EX PADS: 1.0000 "application " | MEDICATED_PAD | CUTANEOUS | Status: DC | PRN

## 2017-09-14 MED ORDER — COCONUT OIL OIL
1.0000 "application " | TOPICAL_OIL | Status: DC | PRN
  Administered 2017-09-14: 1 via TOPICAL
  Filled 2017-09-14: qty 120

## 2017-09-14 MED ORDER — DIBUCAINE 1 % RE OINT: 1.0000 "application " | TOPICAL_OINTMENT | RECTAL | Status: DC | PRN

## 2017-09-14 NOTE — Lactation Note (Signed)
This note was copied from a baby's chart. Lactation Consultation Note  Patient Name: Megan Buchanan ZOXWR'UToday's Date: 09/14/2017 Reason for consult: Initial assessment;Term  P3 mother whose infant is now 5511 hours old.  Mother breast fed her first 2 children for a few months.  Her plan is to breast/bottle feed at home.  Interpreter Jamesetta OrleansEricka [phone] used for interpretation.  Baby had recently been fed 30 mls of [employer].right before I entered.  Explained the formula supplementation guidelines with mother.  She verbalized understanding.    Encouraged mother to always put baby to breast first before any supplementation.  Encouraged to feed 8-12 times/24 hours or sooner if baby shows feeding cues.  Reviewed feeding cues with mother.  She is familiar with hand expression and suggested her to do this before/after feedings.  Colostrum container provided and mother will finger feed any EBM she obtains from hand expression back to baby. I offered to assist with latching at the next feed if needed.  Mom made aware of O/P services, breastfeeding support groups, community resources, and our phone # for post-discharge questions.  [facility] but does not have a DEBP for home use.  She may obtain one before returning to work. Father present.  RN updated.   Maternal Data Formula Feeding for Exclusion: No Has patient been taught Hand Expression?: Yes Does the patient have breastfeeding experience prior to this delivery?: Yes  Feeding Feeding Type: Bottle Fed - Formula Length of feed: 20 min  LATCH Score                   Interventions    Lactation Tools Discussed/Used WIC Program: Yes   Consult Status Consult Status: Follow-up Date: 09/15/17 Follow-up type: In-patient    Khylah Kendra R Windsor Goeken 09/14/2017, 9:17 AM

## 2017-09-14 NOTE — Lactation Note (Signed)
This note was copied from a baby's chart. Lactation Consultation Note  Patient Name: Megan Dustin FolksYesenia Buchanan ZOXWR'UToday's Date: 09/14/2017 Reason for consult: Follow-up assessment;Difficult latch  P3 mother whose infant is now 8415 hours old.  RN called for latch assistance.    Baby was crying as I entered the room.  Offered to assist mother with latching and she accepted.  Calmed baby by allowing her to suck on my gloved finger.  Mother did some hand expression and was able to obtain a few drops of colostrum which I finger fed to baby.  Assisted to latch on the right breast in the football hold without difficulty.  Baby began rhythmic sucking immediately and audible swallows were noted.  Mother felt no pain with the latch.  I showed her how to do breast compressions during feeding and how to maintain a deep latch.  Showed mother proper hand position for compressions.  Mother will allow baby to feed until she self releases and then burp and try second breast.  Mother will do hand expression after feedings.  She will call for further latch assistance as needed.   Maternal Data Formula Feeding for Exclusion: No Has patient been taught Hand Expression?: Yes  Feeding Feeding Type: Breast Fed Length of feed: 10 min(still feeding when I left the room)  LATCH Score Latch: Grasps breast easily, tongue down, lips flanged, rhythmical sucking.  Audible Swallowing: A few with stimulation  Type of Nipple: Everted at rest and after stimulation  Comfort (Breast/Nipple): Soft / non-tender  Hold (Positioning): Assistance needed to correctly position infant at breast and maintain latch.  LATCH Score: 8  Interventions Interventions: Breast feeding basics reviewed;Assisted with latch;Skin to skin;Breast massage;Hand express;Position options;Support pillows;Adjust position;Breast compression  Lactation Tools Discussed/Used     Consult Status Consult Status: Follow-up Date: 09/15/17 Follow-up  type: In-patient    Gurpreet Mariani R Maximillion Gill 09/14/2017, 1:34 PM

## 2017-09-14 NOTE — Progress Notes (Signed)
POSTPARTUM PROGRESS NOTE  Post Partum Day 1 Subjective:  Megan Buchanan is a 32 y.o. G3P3003 4121w4d s/p SVD.  No acute events overnight.  Pt denies problems with ambulating, voiding or po intake.  She denies nausea or vomiting.  Pain is well controlled.  She has had flatus. She has had bowel movement.  Lochia Small.   Objective: Blood pressure (!) 91/56, pulse 63, temperature 99.2 F (37.3 C), resp. rate 18, height 4\' 10"  (1.473 m), weight 72.5 kg, last menstrual period 12/26/2016, SpO2 96 %, unknown if currently breastfeeding.  Physical Exam:  General: alert, cooperative and no distress Lochia:normal flow Chest: CTAB Heart: RRR no m/r/g Abdomen: +BS, soft, nontender,  Uterine Fundus: firm, below umbilicus DVT Evaluation: No calf swelling or tenderness Extremities: no edema  Recent Labs    09/13/17 1541  HGB 12.4  HCT 35.3*    Assessment/Plan:  ASSESSMENT: Megan Buchanan is a 32 y.o. [id number] 1621w4d s/p SVD, recovering well.  #Breast feeding #planning for an interval BTL #Plan for DC home on 9/1     LOS: 1 day   Mirian MoPeter Keliah Harned, MD 09/14/2017, 8:08 AM

## 2017-09-14 NOTE — Anesthesia Postprocedure Evaluation (Signed)
Anesthesia Post Note  Patient: Megan Buchanan  Procedure(s) Performed: AN AD HOC LABOR EPIDURAL     Patient location during evaluation: Mother Baby Anesthesia Type: Epidural Level of consciousness: awake and alert and oriented Pain management: satisfactory to patient Vital Signs Assessment: post-procedure vital signs reviewed and stable Respiratory status: spontaneous breathing and nonlabored ventilation Cardiovascular status: stable Postop Assessment: no headache, no backache, no signs of nausea or vomiting, adequate PO intake, patient able to bend at knees and able to ambulate (patient up walking) Anesthetic complications: no    Last Vitals:  Vitals:   09/14/17 0100 09/14/17 0500  BP: 104/60 (!) 91/56  Pulse: 93 63  Resp: 18 18  Temp: 37.3 C   SpO2:  96%    Last Pain:  Vitals:   09/14/17 0804  TempSrc:   PainSc: 0-No pain   Pain Goal:                 Madison HickmanGREGORY,[name]

## 2017-09-15 MED ORDER — IBUPROFEN 600 MG PO TABS: 600.0000 mg | ORAL_TABLET | Freq: Four times a day (QID) | ORAL | 0 refills | Status: DC | PRN

## 2017-09-15 NOTE — Lactation Note (Signed)
This note was copied from a baby's chart. Lactation Consultation Note  Patient Name: Girl Selinda Bentle NIOEV'O Date: 09/15/2017    P3 mother whose infant is now 62 hours old.  Mother started becoming engorged early this a.m.  She has not breastfed since 2115 last evening.  RN (on night shift) set up DEBP for pumping and gave mother ice packs.  She stated she feels better but desires to bottle feed this morning until she feels better.  I explained the importance of continuing to breast feed or pump.  Mother plans to use the DEBP here until she is discharged today.  She is planning on going to the gift shop on her way out to rent a DEBP for home use.  I offered to assist with latching if she desires.  Mother verbalized understanding and stated that she wants to pump and bottle feed at home because she is going back to work and she has done this in the past with her other children.    Reviewed engorgement prevention/treatment with her.  Mother stated she had to be readmitted to the hospital with her last child due to engorgement and "I do not want that to happen again."  She also has a manual pump for home use.  Mother has no further questions/concerns at this time.  Father present.   Maternal Data    Feeding Feeding Type: Breast Fed Length of feed: 10 min  LATCH Score Latch: Repeated attempts needed to sustain latch, nipple held in mouth throughout feeding, stimulation needed to elicit sucking reflex.  Audible Swallowing: A few with stimulation  Type of Nipple: Everted at rest and after stimulation  Comfort (Breast/Nipple): Filling, red/small blisters or bruises, mild/mod discomfort  Hold (Positioning): No assistance needed to correctly position infant at breast.  LATCH Score: 7  Interventions Interventions: Breast feeding basics reviewed;Assisted with latch;Adjust position;Support pillows;Position options;Comfort gels;DEBP  Lactation Tools Discussed/Used Pump Review: Setup,  frequency, and cleaning Initiated by:: RN Date initiated:: 09/15/17   Consult Status      Silveria Botz R Kamica Florance 09/15/2017, 9:26 AM

## 2017-09-15 NOTE — Discharge Instructions (Signed)
Parto vaginal, cuidados posteriores  Vaginal Delivery, Care After  Siga estas instrucciones durante las prximas semanas. Estas indicaciones le proporcionan informacin acerca de cmo deber cuidarse despus del parto vaginal. Su mdico tambin podr darle indicaciones ms especficas. El tratamiento ha sido planificado segn las prcticas mdicas actuales, pero en algunos casos pueden ocurrir problemas. Llame al mdico si tiene problemas o preguntas.  Qu puedo esperar despus del procedimiento?  Despus de un parto vaginal, es frecuente tener lo siguiente:   Hemorragia leve de la vagina.   Dolor en el abdomen, la vagina y la zona de la piel entre la abertura vaginal y el ano (perineo).   Calambres plvicos.   Fatiga.    Siga estas indicaciones en su casa:  Medicamentos   Tome los medicamentos de venta libre y los recetados solamente como se lo haya indicado el mdico.   Si le recetaron un antibitico, tmelo como se lo haya indicado el mdico. No interrumpa la administracin del antibitico hasta que lo haya terminado.  Conducir     No conduzca ni opere maquinaria pesada mientras toma analgsicos recetados.   No conduzca durante 24horas si le administraron un sedante.  Estilo de vida   No beba alcohol. Esto es de suma importancia si est amamantando o toma analgsicos.   No consuma productos que contengan tabaco, incluidos cigarrillos, tabaco de mascar o cigarrillos electrnicos. Si necesita ayuda para dejar de fumar, consulte al mdico.  Qu debe comer y beber   Beba al menos 8vasos de ochoonzas (240cc) de agua todos los das a menos que el mdico le indique lo contrario. Si elige amamantar al beb, quiz deba beber an ms cantidad de agua.   Coma alimentos ricos en fibras todos los das. Estos alimentos pueden ayudarla a prevenir o aliviar el estreimiento. Los alimentos ricos en fibras incluyen, entre otros:  ? Panes y cereales integrales.  ? Arroz integral.  ? Frijoles.  ? Frutas y verduras  frescas.  Actividad   Retome sus actividades normales como se lo haya indicado el mdico. Pregntele al mdico qu actividades son seguras para usted.   Descanse todo lo que pueda. Trate de descansar o tomar una siesta mientras el beb est durmiendo.   No levante objetos que pesen ms que su beb o 10libras (4,5kg) hasta que el mdico le diga que es seguro.   Hable con el mdico sobre cundo puede retomar la actividad sexual. Esto puede depender de lo siguiente:  ? Riesgo de sufrir una infeccin.  ? Velocidad de cicatrizacin.  ? Comodidad y deseo de retomar la actividad sexual.  Cuidados vaginales   Si le realizaron una episiotoma o tuvo un desgarro vaginal, contrlese la zona todos los das para detectar signos de infeccin. Est atenta a los siguientes signos:  ? Aumento del enrojecimiento, la hinchazn o el dolor.  ? Mayor presencia de lquido o sangre.  ? Calor.  ? Pus o mal olor.   No use tampones ni se haga duchas vaginales hasta que el mdico la autorice.   Controle la sangre que elimina por la vagina para detectar cogulos de sangre. Estos pueden tener el aspecto de grumos de color rojo oscuro, o secrecin marrn o negra.  Instrucciones generales   Mantenga el perineo limpio y seco, como se lo haya indicado el mdico.   Use ropa cmoda y suelta.   Cuando vaya al bao, siempre higiencese de adelante hacia atrs.   Pregntele al mdico si puede ducharse o tomar baos de inmersin.   Si se le realiz una episiotoma o tuvo un desgarro perineal durante el trabajo del parto o el parto, es posible que el mdico le indique que no tome baos de inmersin durante un determinado tiempo.   Use un sostn que sujete y ajuste bien sus pechos.   Si es posible, pdale a alguien que la ayude con las tareas del hogar y a cuidar del beb durante al menos algunos das despus de que le den el alta del hospital.   Concurra a todas las visitas de seguimiento para usted y el beb, como se lo haya indicado el  mdico. Esto es importante.  Comunquese con un mdico si:   Tiene los siguientes sntomas:  ? Secrecin vaginal que tiene mal olor.  ? Dificultad para orinar.  ? Dolor al orinar.  ? Aumento o disminucin repentinos de la frecuencia de las deposiciones.  ? Ms enrojecimiento, hinchazn o dolor alrededor de la episiotoma o del desgarro vaginal.  ? Ms secrecin de lquido o sangre de la episiotoma o del desgarro vaginal.  ? Pus o mal olor proveniente de la episiotoma o del desgarro vaginal.  ? Fiebre.  ? Erupcin cutnea.  ? Poco inters o falta de inters en actividades que solan gustarle.  ? Dudas sobre su cuidado y el del beb.   Siente la episiotoma o el desgarro vaginal caliente al tacto.   La episiotoma o el desgarro vaginal se abren o no parecen cicatrizar.   Siente dolor en las mamas, o estn duras o enrojecidas.   Siente tristeza o preocupacin de forma inusual.   Siente nuseas o vomita.   Elimina cogulos de sangre grandes por la vagina. Si expulsa un cogulo de sangre por la vagina, gurdelo para mostrrselo a su mdico. No tire la cadena sin que el mdico examine el cogulo de sangre antes.   Orina ms de lo habitual.   Se siente mareada o se desmaya.   No ha amamantado para nada y no ha tenido un perodo menstrual durante 12 semanas despus del parto.   Dej de amamantar al beb y no ha tenido su perodo menstrual durante 12 semanas despus de dejar de amamantar.  Solicite ayuda de inmediato si:   Tiene los siguientes sntomas:  ? Dolor que no desaparece o no mejora con medicamentos.  ? Dolor en el pecho.  ? Dificultad para respirar.  ? Visin borrosa o manchas en la vista.  ? Pensamientos de autolesionarse o lesionar al beb.   Comienza a sentir dolor en el abdomen o en una de las piernas.   Presenta un dolor de cabeza intenso.   Se desmaya.   Tiene una hemorragia de la vagina tan intensa que empapa dos toallitas sanitarias en una hora.  Esta informacin no tiene como fin  reemplazar el consejo del mdico. Asegrese de hacerle al mdico cualquier pregunta que tenga.  Document Released: 01/01/2005 Document Revised: 04/25/2016 Document Reviewed: 01/16/2015  Elsevier Interactive Patient Education  2018 [employer]

## 2017-09-15 NOTE — Discharge Summary (Signed)
* visit conducted with Spanish interpreter   OB Discharge Summary     Patient Name: Megan Buchanan DOB: [birth year] MRN: [id number]  Date of admission: 09/13/2017 Delivering MD: Sharyon Cable   Date of discharge: 09/15/2017  Admitting diagnosis: 38WKS, WATER BROKE Intrauterine pregnancy: [redacted]w[redacted]d     Secondary diagnosis:  Principal Problem:   History of cesarean section Active Problems:   PROM (premature rupture of membranes)   VBAC, delivered  Additional problems: none     Discharge diagnosis: Term Pregnancy Delivered and VBAC                                                                                                Post partum procedures:none  Augmentation: none  Complications: None  Hospital course:  Onset of Labor With Vaginal Delivery     32 y.o. yo G3P3003 at [redacted]w[redacted]d was admitted in Active Labor on 09/13/2017. Patient had an uncomplicated labor course as follows:  Membrane Rupture Time/Date: 1:00 PM ,09/13/2017   Intrapartum Procedures: Episiotomy: None [1]                                         Lacerations:  None [1]  Patient had a delivery of a Viable infant. 09/13/2017  Information for the patient's newborn:  Dutch Gray [[id number]]  Delivery Method: VBAC, Spontaneous(Filed from Delivery Summary)    Pateint had an uncomplicated postpartum course.  She is ambulating, tolerating a regular diet, passing flatus, and urinating well. Patient is discharged home in stable condition on 09/15/17.   Physical exam  Vitals:   09/14/17 0500 09/14/17 1359 09/14/17 2212 09/15/17 0628  BP: (!) 91/56 97/60 (!) 102/57 118/65  Pulse: 63 77 71 77  Resp: 18 16 16 16   Temp:  98.4 F (36.9 C) 98.9 F (37.2 C) 97.9 F (36.6 C)  TempSrc:  Oral Oral Oral  SpO2: 96% 98% 100% 99%  Weight:      Height:       General: alert and cooperative Lochia: appropriate Uterine Fundus: firm Incision: N/A DVT Evaluation: No evidence of DVT seen on physical exam. Labs: Lab  Results  Component Value Date   WBC 9.2 09/13/2017   HGB 12.4 09/13/2017   HCT 35.3 (L) 09/13/2017   MCV 89.8 09/13/2017   PLT 246 09/13/2017   CMP Latest Ref Rng & Units 11/13/2012  Glucose 70 - 99 mg/dL 87  BUN 6 - 23 mg/dL 8  Creatinine 8.09 - 9.83 mg/dL 3.82  Sodium 505 - 397 mEq/L 136  Potassium 3.5 - 5.3 mEq/L 3.8  Chloride 96 - 112 mEq/L 103  CO2 19 - 32 mEq/L 22  Calcium 8.4 - 10.5 mg/dL 9.5  Total Protein 6.0 - 8.3 g/dL 7.0  Total Bilirubin 0.3 - 1.2 mg/dL 0.4  Alkaline Phos 39 - 117 U/L 198(H)  AST 0 - 37 U/L 30  ALT 0 - 35 U/L 38(H)    Discharge instruction: per After Visit Summary and "Baby and Me Booklet".  After visit  meds:  Allergies as of 09/15/2017   No Known Allergies     Medication List    STOP taking these medications   acetaminophen 500 MG tablet Commonly known as:  TYLENOL     TAKE these medications   ibuprofen 600 MG tablet Commonly known as:  ADVIL,MOTRIN Take 1 tablet (600 mg total) by mouth every 6 (six) hours as needed.   prenatal multivitamin Tabs tablet Take 1 tablet by mouth daily at 12 noon.       Diet: routine diet  Activity: Advance as tolerated. Pelvic rest for 6 weeks.   Outpatient follow up:4 weeks Follow up Appt:No future appointments. Follow up Visit:No follow-ups on file.  Postpartum contraception: unsure; extensive conversation re her desire for BTL- rec she discuss this at the [facility]/cashier's office at [facility]  Newborn Data: Live born female  Birth Weight: 6 lb 7.5 oz (2934 g) APGAR: 9, 9  Newborn Delivery   Birth date/time:  09/13/2017 21:45:00 Delivery type:  VBAC, Spontaneous     Baby Feeding: Breast Disposition:home with mother   09/15/2017 Cam Hai, CNM

## 2019-04-09 ENCOUNTER — Ambulatory Visit: Payer: Self-pay | Attending: Internal Medicine

## 2019-04-09 DIAGNOSIS — Z23 Encounter for immunization: Secondary | ICD-10-CM

## 2019-04-30 ENCOUNTER — Ambulatory Visit: Payer: Self-pay | Attending: Internal Medicine

## 2019-04-30 DIAGNOSIS — Z23 Encounter for immunization: Secondary | ICD-10-CM

## 2019-05-04 ENCOUNTER — Ambulatory Visit: Payer: Self-pay

## 2021-05-22 ENCOUNTER — Encounter (HOSPITAL_COMMUNITY): Payer: Self-pay | Admitting: Emergency Medicine

## 2021-05-22 ENCOUNTER — Emergency Department (HOSPITAL_COMMUNITY): Payer: Self-pay

## 2021-05-22 ENCOUNTER — Emergency Department (HOSPITAL_COMMUNITY)
Admission: EM | Admit: 2021-05-22 | Discharge: 2021-05-23 | Disposition: A | Discharge status: Home / Self Care | Payer: Self-pay | Attending: Emergency Medicine | Admitting: Emergency Medicine

## 2021-05-22 ENCOUNTER — Other Ambulatory Visit: Payer: Self-pay

## 2021-05-22 DIAGNOSIS — J020 Streptococcal pharyngitis: Secondary | ICD-10-CM | POA: Insufficient documentation

## 2021-05-22 DIAGNOSIS — D72829 Elevated white blood cell count, unspecified: Secondary | ICD-10-CM | POA: Insufficient documentation

## 2021-05-22 DIAGNOSIS — Z20822 Contact with and (suspected) exposure to covid-19: Secondary | ICD-10-CM | POA: Insufficient documentation

## 2021-05-22 DIAGNOSIS — R300 Dysuria: Secondary | ICD-10-CM | POA: Insufficient documentation

## 2021-05-22 DIAGNOSIS — R000 Tachycardia, unspecified: Secondary | ICD-10-CM | POA: Insufficient documentation

## 2021-05-22 DIAGNOSIS — E860 Dehydration: Secondary | ICD-10-CM | POA: Insufficient documentation

## 2021-05-22 LAB — PREGNANCY, URINE: Preg Test, Ur: NEGATIVE

## 2021-05-22 LAB — CBC WITH DIFFERENTIAL/PLATELET
Abs Immature Granulocytes: 0.16 10*3/uL — ABNORMAL HIGH (ref 0.00–0.07)
Basophils Absolute: 0.1 10*3/uL (ref 0.0–0.1)
Basophils Relative: 0 %
Eosinophils Absolute: 0 10*3/uL (ref 0.0–0.5)
Eosinophils Relative: 0 %
HCT: 39.1 % (ref 36.0–46.0)
Hemoglobin: 13.7 g/dL (ref 12.0–15.0)
Immature Granulocytes: 1 %
Lymphocytes Relative: 5 %
Lymphs Abs: 1.1 10*3/uL (ref 0.7–4.0)
MCH: 31.5 pg (ref 26.0–34.0)
MCHC: 35 g/dL (ref 30.0–36.0)
MCV: 89.9 fL (ref 80.0–100.0)
Monocytes Absolute: 0.8 10*3/uL (ref 0.1–1.0)
Monocytes Relative: 3 %
Neutro Abs: 22.6 10*3/uL — ABNORMAL HIGH (ref 1.7–7.7)
Neutrophils Relative %: 91 %
Platelets: 301 10*3/uL (ref 150–400)
RBC: 4.35 MIL/uL (ref 3.87–5.11)
RDW: 12.5 % (ref 11.5–15.5)
WBC: 24.7 10*3/uL — ABNORMAL HIGH (ref 4.0–10.5)
nRBC: 0 % (ref 0.0–0.2)

## 2021-05-22 LAB — LACTIC ACID, PLASMA
Lactic Acid, Venous: 0.8 mmol/L (ref 0.5–1.9)
Lactic Acid, Venous: 0.9 mmol/L (ref 0.5–1.9)

## 2021-05-22 LAB — URINALYSIS, ROUTINE W REFLEX MICROSCOPIC
Bilirubin Urine: NEGATIVE
Glucose, UA: NEGATIVE mg/dL
Hgb urine dipstick: NEGATIVE
Ketones, ur: NEGATIVE mg/dL
Nitrite: NEGATIVE
Protein, ur: NEGATIVE mg/dL
Specific Gravity, Urine: 1.01 (ref 1.005–1.030)
pH: 8 (ref 5.0–8.0)

## 2021-05-22 LAB — URINALYSIS, MICROSCOPIC (REFLEX)

## 2021-05-22 LAB — BASIC METABOLIC PANEL
Anion gap: 8 (ref 5–15)
BUN: 11 mg/dL (ref 6–20)
CO2: 26 mmol/L (ref 22–32)
Calcium: 9.6 mg/dL (ref 8.9–10.3)
Chloride: 102 mmol/L (ref 98–111)
Creatinine, Ser: 1.01 mg/dL — ABNORMAL HIGH (ref 0.44–1.00)
GFR, Estimated: >60 mL/min (ref >60)
Glucose, Bld: 107 mg/dL — ABNORMAL HIGH (ref 70–99)
Potassium: 3.9 mmol/L (ref 3.5–5.1)
Sodium: 136 mmol/L (ref 135–145)

## 2021-05-22 LAB — RESP PANEL BY RT-PCR (FLU A&B, COVID) ARPGX2
Influenza A by PCR: NEGATIVE
Influenza B by PCR: NEGATIVE
SARS Coronavirus 2 by RT PCR: NEGATIVE

## 2021-05-22 LAB — GROUP A STREP BY PCR: Group A Strep by PCR: DETECTED — AB

## 2021-05-22 MED ORDER — ACETAMINOPHEN 500 MG PO TABS
1000.0000 mg | ORAL_TABLET | Freq: Once | ORAL | Status: AC
  Administered 2021-05-22: 1000 mg via ORAL
  Filled 2021-05-22: qty 2

## 2021-05-22 MED ORDER — MORPHINE SULFATE (PF) 4 MG/ML IV SOLN
4.0000 mg | Freq: Once | INTRAVENOUS | Status: AC
  Administered 2021-05-22: 4 mg via INTRAVENOUS
  Filled 2021-05-22: qty 1

## 2021-05-22 MED ORDER — SODIUM CHLORIDE 0.9 % IV SOLN
2.0000 g | Freq: Once | INTRAVENOUS | Status: AC
  Administered 2021-05-22: 2 g via INTRAVENOUS
  Filled 2021-05-22: qty 20

## 2021-05-22 MED ORDER — SODIUM CHLORIDE 0.9 % IV BOLUS
1000.0000 mL | Freq: Once | INTRAVENOUS | Status: AC
  Administered 2021-05-22: 1000 mL via INTRAVENOUS

## 2021-05-22 MED ORDER — ONDANSETRON HCL 4 MG/2ML IJ SOLN
4.0000 mg | Freq: Once | INTRAMUSCULAR | Status: AC
  Administered 2021-05-22: 4 mg via INTRAVENOUS
  Filled 2021-05-22: qty 2

## 2021-05-22 MED ORDER — SODIUM CHLORIDE 0.9 % IV SOLN
1000.0000 mL | INTRAVENOUS | Status: DC
  Administered 2021-05-22: 1000 mL via INTRAVENOUS

## 2021-05-22 MED ORDER — IBUPROFEN 400 MG PO TABS
600.0000 mg | ORAL_TABLET | Freq: Once | ORAL | Status: AC
  Administered 2021-05-22: 600 mg via ORAL
  Filled 2021-05-22: qty 1

## 2021-05-22 MED ORDER — AMOXICILLIN 500 MG PO CAPS: 500.0000 mg | ORAL_CAPSULE | Freq: Two times a day (BID) | ORAL | 0 refills | Status: AC

## 2021-05-22 MED ORDER — SODIUM CHLORIDE 0.9 % IV BOLUS (SEPSIS)
1000.0000 mL | Freq: Once | INTRAVENOUS | Status: AC
  Administered 2021-05-22: 1000 mL via INTRAVENOUS

## 2021-05-22 MED ORDER — IBUPROFEN 600 MG PO TABS: 600.0000 mg | ORAL_TABLET | Freq: Three times a day (TID) | ORAL | 0 refills | Status: AC | PRN

## 2021-05-22 NOTE — ED Notes (Signed)
Patient ambulated to the bathroom with a steady gait

## 2021-05-22 NOTE — ED Provider Triage Note (Signed)
Emergency Medicine Provider Triage Evaluation Note ? ?Megan Buchanan , a 36 y.o. female  was evaluated in triage.  Pt complains of fever, generalized myalgia, sore throat, and dysuria.  Patient reports that her fever, generalized myalgia, and sore throat have been present over the last week and a half.  Patient reports that she started having dysuria and some left-sided flank pain earlier today. ? ?Patient denies any cough, abdominal pain, nausea, vomiting, diarrhea, hematuria, urinary urgency, vaginal pain, vaginal bleeding, vaginal discharge, trouble swallowing, neck stiffness, shortness of breath. ? ?Review of Systems  ?Positive: See above ?Negative: See above ? ?Physical Exam  ?BP 113/76   Pulse (!) 136   Temp (!) 100.4 ?F (38 ?C) (Oral)   Resp (!) 22   Ht 4\' 10"  (1.473 m)   Wt 72 kg   SpO2 100%   BMI 33.17 kg/m?  ?Gen:   Awake, no distress   ?Resp:  Normal effort, clear to auscultation bilaterally ?MSK:   Moves extremities without difficulty  ?Other:  Swelling and exudate noted to tonsils bilaterally.  Patient able to handle oral secretions without difficulty.  No swelling to submandibular space.   ? ?Abdomen soft, nondistended, nontender no guarding or rebound tenderness, negative CVA tenderness bilaterally. ? ?Medical Decision Making  ?Medically screening exam initiated at 5:36 PM.  Appropriate orders placed.  was informed that the remainder of the evaluation will be completed by another provider, this initial triage assessment does not replace that evaluation, and the importance of remaining in the ED until their evaluation is complete. ? ? ?  ?Megan Parma, PA-C ?05/22/21 1738 ? ?

## 2021-05-22 NOTE — ED Provider Notes (Signed)
?[facility] EMERGENCY DEPARTMENT ?Provider Note ? ? ?CSN: [id number] ?Arrival date & time: 05/22/21  1718 ? ?  ? ?History ? ?Chief Complaint  ?Patient presents with  ? Fever  ? ? ?Megan Buchanan is a 36 y.o. female. ? ? ?Fever ? ?Patient has history of depression but no other significant medical problems.  Patient presents to the ED with complaints of fever body aches sore throat and dysuria.  Symptoms started over the last week or so.  Patient states she was having fevers or chills.  She was treating with over-the-counter antipyretics.  Patient states her throat continue to bother her and it hurts to swallow.  She also started having myalgias and pain in her bones.  Patient states she took 1 dose of amoxicillin that she had at the house today.  Today she also noted that she was having pain with urination and some pain in her left flank area.  She has not had any vomiting.  She denies abdominal pain.  She denies any vaginal bleeding or discharge.  No neck stiffness ? ?Home Medications ?Prior to Admission medications   ?Medication Sig Start Date End Date Taking? Authorizing Provider  ?amoxicillin (AMOXIL) 500 MG capsule Take 1 capsule (500 mg total) by mouth 2 (two) times daily for 10 days. 05/22/21 06/01/21 Yes Linwood Dibbles, MD  ?ibuprofen (ADVIL) 600 MG tablet Take 1 tablet (600 mg total) by mouth every 8 (eight) hours as needed for up to 7 days for fever, headache, mild pain or moderate pain. 05/22/21 05/29/21 Yes Linwood Dibbles, MD  ?Prenatal Vit-Fe Fumarate-FA (PRENATAL MULTIVITAMIN) TABS tablet Take 1 tablet by mouth daily at 12 noon.    [provider]  ?   ? ?Allergies    ?Patient has no known allergies.   ? ?Review of Systems   ?Review of Systems  ?Constitutional:  Positive for fever.  ? ?Physical Exam ?Updated Vital Signs ?BP 98/63   Pulse (!) 108   Temp (!) 102.3 ?F (39.1 ?C) (Oral)   Resp (!) 27   Ht 1.473 m (4\' 10" )   Wt 72 kg   SpO2 100%   BMI 33.17 kg/m?  ?Physical  Exam ?Vitals and nursing note reviewed.  ?Constitutional:   ?   General: She is not in acute distress. ?   Appearance: She is well-developed.  ?HENT:  ?   Head: Normocephalic and atraumatic.  ?   Right Ear: External ear normal.  ?   Left Ear: External ear normal.  ?   Mouth/Throat:  ?   Pharynx: Posterior oropharyngeal erythema present. No oropharyngeal exudate.  ?Eyes:  ?   General: No scleral icterus.    ?   Right eye: No discharge.     ?   Left eye: No discharge.  ?   Conjunctiva/sclera: Conjunctivae normal.  ?Neck:  ?   Trachea: No tracheal deviation.  ?Cardiovascular:  ?   Rate and Rhythm: Regular rhythm. Tachycardia present.  ?Pulmonary:  ?   Effort: Pulmonary effort is normal. No respiratory distress.  ?   Breath sounds: Normal breath sounds. No stridor. No wheezing or rales.  ?Abdominal:  ?   General: Bowel sounds are normal. There is no distension.  ?   Palpations: Abdomen is soft.  ?   Tenderness: There is no abdominal tenderness. There is no guarding or rebound.  ?Musculoskeletal:     ?   General: No tenderness or deformity.  ?   Cervical back: Neck supple. No  rigidity.  ?Skin: ?   General: Skin is warm and dry.  ?   Findings: No rash.  ?Neurological:  ?   General: No focal deficit present.  ?   Mental Status: She is alert.  ?   Cranial Nerves: No cranial nerve deficit (no facial droop, extraocular movements intact, no slurred speech).  ?   Sensory: No sensory deficit.  ?   Motor: No abnormal muscle tone or seizure activity.  ?   Coordination: Coordination normal.  ?Psychiatric:     ?   Mood and Affect: Mood normal.  ? ? ?ED Results / Procedures / Treatments   ?Labs ?(all labs ordered are listed, but only abnormal results are displayed) ?Labs Reviewed  ?GROUP A STREP BY PCR - Abnormal; Notable for the following components:  ?    Result Value  ? Group A Strep by PCR DETECTED (*)   ? All other components within normal limits  ?URINALYSIS, ROUTINE W REFLEX MICROSCOPIC - Abnormal; Notable for the following  components:  ? Leukocytes,Ua SMALL (*)   ? All other components within normal limits  ?BASIC METABOLIC PANEL - Abnormal; Notable for the following components:  ? Glucose, Bld 107 (*)   ? Creatinine, Ser 1.01 (*)   ? All other components within normal limits  ?CBC WITH DIFFERENTIAL/PLATELET - Abnormal; Notable for the following components:  ? WBC 24.7 (*)   ? Neutro Abs 22.6 (*)   ? Abs Immature Granulocytes 0.16 (*)   ? All other components within normal limits  ?URINALYSIS, MICROSCOPIC (REFLEX) - Abnormal; Notable for the following components:  ? Bacteria, UA MANY (*)   ? All other components within normal limits  ?RESP PANEL BY RT-PCR (FLU A&B, COVID) [id number]  ?URINE CULTURE  ?CULTURE, BLOOD (ROUTINE X 2)  ?CULTURE, BLOOD (ROUTINE X 2)  ?PREGNANCY, URINE  ?LACTIC ACID, PLASMA  ?LACTIC ACID, PLASMA  ? ? ?EKG ?None ? ?Radiology ?DG Chest 2 View ? ?Result Date: 05/22/2021 ?CLINICAL DATA:  Fever. EXAM: CHEST - 2 VIEW COMPARISON:  None Available. FINDINGS: Cardiac silhouette and mediastinal contours are within normal limits. The lungs are clear. No pleural effusion or pneumothorax. Mild multilevel degenerative disc changes of the thoracic spine. Minimal anterior wedging of the approximate L1 vertebral body with superior and inferior degenerative disc and endplate changes. IMPRESSION: No active cardiopulmonary disease. Electronically Signed   By: Neita Garnetonald  Viola M.D.   On: 05/22/2021 19:42   ? ?Procedures ?Procedures  ? ? ?Medications Ordered in ED ?Medications  ?sodium chloride 0.9 % bolus 1,000 mL (0 mLs Intravenous Stopped 05/22/21 2014)  ?  Followed by  ?0.9 %  sodium chloride infusion (1,000 mLs Intravenous New Bag/Given 05/22/21 2058)  ?morphine (PF) 4 MG/ML injection 4 mg (4 mg Intravenous Given 05/22/21 1946)  ?ondansetron [facility](ZOFRAN) injection 4 mg (4 mg Intravenous Given 05/22/21 1939)  ?acetaminophen (TYLENOL) tablet 1,000 mg (1,000 mg Oral Given 05/22/21 1952)  ?cefTRIAXone (ROCEPHIN) 2 g in sodium chloride 0.9 % 100 mL IVPB  (0 g Intravenous Stopped 05/22/21 2026)  ?sodium chloride 0.9 % bolus 1,000 mL (0 mLs Intravenous Stopped 05/22/21 2249)  ?ibuprofen (ADVIL) tablet 600 mg (600 mg Oral Given 05/22/21 2309)  ? ? ?ED Course/ Medical Decision Making/ A&P ?Clinical Course as of 05/22/21 2343  ?Mon May 22, 2021  ?1914 Basic metabolic panel(!) ?nl [JK]  ?1914 CBC with Differential(!) ?Elevated wbc [JK]  ?1944 Urinalysis, Routine w reflex microscopic Urine, Clean Catch(!) ?Urinalysis shows small leukocyte esterase but patient does  have many bacteria. [JK]  ?1954 Group A Strep by PCR(!) ?Strep test positive [JK]  ?1954 Resp Panel by RT-PCR (Flu A&B, Covid) Throat ?COVID and flu are negative [JK]  ?2233 Lactic acid, plasma ?Lactic acid level not elevated [JK]  ?2316 Heart rate improving.   Fever decreasing.  Pt feeling much better ? [JK]  ?  ?Clinical Course User Index ?[JK] Linwood Dibbles, MD  ? ?                        ?Medical Decision Making ?Amount and/or Complexity of Data Reviewed ?Labs: ordered. Decision-making details documented in ED Course. ?Radiology: ordered. ? ?Risk ?OTC drugs. ?Prescription drug management. ? ?Patient presented to the ED for evaluation acute fever.  Concerned about the possibility of pyelonephritis, influenza, sepsis, pneumonia.  Patient was notably febrile and had a significant leukocytosis.  Urinalysis ultimately showed many bacteria but 6-10 squamous cells only 6-10 whites.  Not clearly associated with infection.  Urine culture was sent off for further analysis.  Metabolic panel also reassuring.  COVID and flu were negative.  Patient did have a positive strep test.  This correlates with her symptoms.  Lactic acid level was normal.  Her tachycardia decreased with IV hydration and fever resolution.  Patient was feeling better and able to ambulate without difficulty.  At this time I doubt severe systemic infection.  She appears stable for discharge and close outpatient follow-up. ? ? ? ? ? ? ? ? ?Final Clinical  Impression(s) / ED Diagnoses ?Final diagnoses:  ?Strep pharyngitis  ?Dehydration  ? ? ?Rx / DC Orders ?ED Discharge Orders   ? ?      Ordered  ?  amoxicillin (AMOXIL) 500 MG capsule  2 times daily       ? 05/22/21 2326  ?  ibu

## 2021-05-22 NOTE — Discharge Instructions (Addendum)
Take the antibiotics as prescribed.  Continue Tylenol and ibuprofen for your fever.  Return to the ER as needed for worsening symptoms or if you are not improving in the next few days. ?

## 2021-05-22 NOTE — ED Triage Notes (Signed)
Pt endorses fever for a week and half. Also having sore throat, body aches, and pain with urination.  ?

## 2021-05-23 NOTE — ED Notes (Signed)
All discharge instructions including follow up care and prescriptions reviewed with patient and patient verbalized understanding of same. Patient stable at time of discharge and was provided a wheel chair prior to leaving the department.  ?

## 2021-05-24 LAB — URINE CULTURE: Culture: >=100000 — AB

## 2021-05-25 ENCOUNTER — Telehealth: Payer: Self-pay

## 2021-05-25 NOTE — Telephone Encounter (Signed)
Post ED Visit - Positive Culture Follow-up: Unsuccessful Patient Follow-up ? ?Culture assessed and recommendations reviewed by: ? ?[x]  , Pharm.D. ?[]  Sula Soda, Pharm.D., BCPS AQ-ID ?[]  , Pharm.D., BCPS ?[]  Celedonio Miyamoto, Pharm.D., BCPS ?[]  [place], Garvin Fila.D., BCPS, AAHIVP ?[]  , Pharm.D., BCPS, AAHIVP ?[]  Georgina Pillion, PharmD ?[]  , PharmD, BCPS ? ?Positive urine culture ? ?[]  Patient discharged without antimicrobial prescription and treatment is now indicated ?[x]  Organism is resistant to prescribed ED discharge antimicrobial ?[]  Patient with positive blood cultures ? ?Reviewed by ED provider - [place], MD ? ?Patient treated with Amoxicillin appropriate for positive strep.  ? ?Call patient and assess if urinary symptoms present if symptomatic add Bactrim DS 1 tab BID x 7 days.  ? ?Unable to contact patient after multiple attempts, letter will be sent to address on file ? ?[address] ?05/25/2021, 11:41 AM  ?

## 2021-05-25 NOTE — Progress Notes (Signed)
ED Antimicrobial Stewardship Positive Culture Follow Up  ? ?Megan Buchanan is an 36 y.o. female who presented to [facility] on 05/22/2021 with a chief complaint of  ?Chief Complaint  ?Patient presents with  ? Fever  ? ? ?Recent Results (from the past 720 hour(s))  ?Resp Panel by RT-PCR (Flu A&B, Covid) Throat     Status: None  ? Collection Time: 05/22/21  5:36 PM  ? Specimen: Throat; Nasopharyngeal(NP) swabs in vial transport medium  ?Result Value Ref Range Status  ? SARS Coronavirus 2 by RT PCR NEGATIVE NEGATIVE Final  ?  Comment: (NOTE) ?SARS-CoV-2 target nucleic acids are NOT DETECTED. ? ?The SARS-CoV-2 RNA is generally detectable in upper respiratory ?specimens during the acute phase of infection. The lowest ?concentration of SARS-CoV-2 viral copies this assay can detect is ?138 copies/mL. A negative result does not preclude SARS-Cov-2 ?infection and should not be used as the sole basis for treatment or ?other patient management decisions. A negative result may occur with  ?improper specimen collection/handling, submission of specimen other ?than nasopharyngeal swab, presence of viral mutation(s) within the ?areas targeted by this assay, and inadequate number of viral ?copies(<138 copies/mL). A negative result must be combined with ?clinical observations, patient history, and epidemiological ?information. The expected result is Negative. ? ?Fact Sheet for Patients:  ?BloggerCourse.com ? ?Fact Sheet for Healthcare Providers:  ?SeriousBroker.it ? ?This test is no t yet approved or cleared by the [place] FDA and  ?has been authorized for detection and/or diagnosis of SARS-CoV-2 by ?FDA under an Emergency Use Authorization (EUA). This EUA will remain  ?in effect (meaning this test can be used) for the duration of the ?COVID-19 declaration under Section 564(b)(1) of the Act, 21 ?U.S.C.section 360bbb-3(b)(1), unless the authorization is terminated  ?or  revoked sooner.  ? ? ?  ? Influenza A by PCR NEGATIVE NEGATIVE Final  ? Influenza B by PCR NEGATIVE NEGATIVE Final  ?  Comment: (NOTE) ?The Xpert Xpress SARS-CoV-2/FLU/RSV plus assay is intended as an aid ?in the diagnosis of influenza from Nasopharyngeal swab specimens and ?should not be used as a sole basis for treatment. Nasal washings and ?aspirates are unacceptable for Xpert Xpress SARS-CoV-2/FLU/RSV ?testing. ? ?Fact Sheet for Patients: ?BloggerCourse.com ? ?Fact Sheet for Healthcare Providers: ?SeriousBroker.it ? ?This test is not yet approved or cleared by the [place] FDA and ?has been authorized for detection and/or diagnosis of SARS-CoV-2 by ?FDA under an Emergency Use Authorization (EUA). This EUA will remain ?in effect (meaning this test can be used) for the duration of the ?COVID-19 declaration under Section 564(b)(1) of the Act, 21 U.S.C. ?section 360bbb-3(b)(1), unless the authorization is terminated or ?revoked. ? ?Performed at [facility], [address] [place], [place] ?[address] ?  ?Group A Strep by PCR     Status: Abnormal  ? Collection Time: 05/22/21  5:36 PM  ? Specimen: Throat; Sterile Swab  ?Result Value Ref Range Status  ? Group A Strep by PCR DETECTED (A) NOT DETECTED Final  ?  Comment: Performed at [facility], [address], [place], [place] [address]  ?Urine Culture     Status: Abnormal  ? Collection Time: 05/22/21  5:42 PM  ? Specimen: Urine, Clean Catch  ?Result Value Ref Range Status  ? Specimen Description URINE, CLEAN CATCH  Final  ? Special Requests   Final  ?  NONE ?Performed at [facility], [address], [place], [place] [address] ?  ? Culture >=  100,000 COLONIES/mL ESCHERICHIA COLI (A)  Final  ? Report Status 05/24/2021 FINAL  Final  ? Organism ID, Bacteria ESCHERICHIA COLI (A)  Final  ?    Susceptibility  ? Escherichia coli - MIC*  ?  AMPICILLIN >=32 RESISTANT Resistant   ?  CEFAZOLIN 16 SENSITIVE  Sensitive   ?  CEFEPIME <=0.12 SENSITIVE Sensitive   ?  CEFTRIAXONE <=0.25 SENSITIVE Sensitive   ?  CIPROFLOXACIN <=0.25 SENSITIVE Sensitive   ?  GENTAMICIN <=1 SENSITIVE Sensitive   ?  IMIPENEM <=0.25 SENSITIVE Sensitive   ?  NITROFURANTOIN <=16 SENSITIVE Sensitive   ?  TRIMETH/SULFA <=20 SENSITIVE Sensitive   ?  AMPICILLIN/SULBACTAM >=32 RESISTANT Resistant   ?  PIP/TAZO >=128 RESISTANT Resistant   ?  * >=100,000 COLONIES/mL ESCHERICHIA COLI  ?Blood culture (routine x 2)     Status: None (Preliminary result)  ? Collection Time: 05/22/21  9:00 PM  ? Specimen: BLOOD LEFT HAND  ?Result Value Ref Range Status  ? Specimen Description BLOOD LEFT HAND  Final  ? Special Requests   Final  ?  BOTTLES DRAWN AEROBIC AND ANAEROBIC Blood Culture results may not be optimal due to an excessive volume of blood received in culture bottles  ? Culture   Final  ?  NO GROWTH 3 DAYS ?Performed at [facility], [address], [place], [place] [address] ?  ? Report Status PENDING  Incomplete  ?Blood culture (routine x 2)     Status: None (Preliminary result)  ? Collection Time: 05/22/21  9:00 PM  ? Specimen: BLOOD RIGHT ARM  ?Result Value Ref Range Status  ? Specimen Description BLOOD RIGHT ARM  Final  ? Special Requests   Final  ?  BOTTLES DRAWN AEROBIC AND ANAEROBIC Blood Culture adequate volume  ? Culture   Final  ?  NO GROWTH 3 DAYS ?Performed at [facility], [address] [place], [place] [address] ?  ? Report Status PENDING  Incomplete  ? ? ?[x]  Treated with Amoxicillin (strep throat), organism resistant to prescribed antimicrobial ?[x]  Patient discharged originally without antimicrobial agent for UTI/pyelo and treatment may be indicated ? ?Call patient and assess if urinary symptoms have resolved (dysuria, flank pain with urination). If symptoms still present, new antibiotic prescription needed. ?If symptomatic, new antibiotic prescription: Bactrim DS 1 tab PO BID x 7 days ?Continue amoxicillin course for strep  throat ? ?ED Provider: , MD ? ?Thank you for involving pharmacy in this patient's care. ? ? , PharmD ?PGY1 Ambulatory Care Pharmacy Resident ?05/25/2021 9:32 AM ?Clinical Pharmacist ?Monday - Friday phone -  [phone] ?Saturday - Sunday phone - [phone] ? ?

## 2021-05-27 LAB — CULTURE, BLOOD (ROUTINE X 2)
Culture: NO GROWTH
Culture: NO GROWTH
Special Requests: ADEQUATE

## 2022-12-26 ENCOUNTER — Encounter (HOSPITAL_COMMUNITY): Payer: Self-pay | Admitting: Emergency Medicine

## 2023-01-23 ENCOUNTER — Ambulatory Visit (HOSPITAL_COMMUNITY)
Admission: EM | Admit: 2023-01-23 | Discharge: 2023-01-23 | Disposition: A | Discharge status: Home / Self Care | Payer: Self-pay | Attending: Family Medicine | Admitting: Family Medicine

## 2023-01-23 ENCOUNTER — Encounter (HOSPITAL_COMMUNITY): Payer: Self-pay

## 2023-01-23 DIAGNOSIS — J069 Acute upper respiratory infection, unspecified: Secondary | ICD-10-CM | POA: Insufficient documentation

## 2023-01-23 DIAGNOSIS — R1011 Right upper quadrant pain: Secondary | ICD-10-CM | POA: Insufficient documentation

## 2023-01-23 LAB — COMPREHENSIVE METABOLIC PANEL
ALT: 38 U/L (ref 0–44)
AST: 24 U/L (ref 15–41)
Albumin: 4.1 g/dL (ref 3.5–5.0)
Alkaline Phosphatase: 51 U/L (ref 38–126)
Anion gap: 14 (ref 5–15)
BUN: 17 mg/dL (ref 6–20)
CO2: 20 mmol/L — ABNORMAL LOW (ref 22–32)
Calcium: 9.7 mg/dL (ref 8.9–10.3)
Chloride: 101 mmol/L (ref 98–111)
Creatinine, Ser: 0.76 mg/dL (ref 0.44–1.00)
GFR, Estimated: >60 mL/min (ref >60)
Glucose, Bld: 75 mg/dL (ref 70–99)
Potassium: 3.5 mmol/L (ref 3.5–5.1)
Sodium: 135 mmol/L (ref 135–145)
Total Bilirubin: 0.4 mg/dL (ref 0.0–1.2)
Total Protein: 7.9 g/dL (ref 6.5–8.1)

## 2023-01-23 LAB — CBC WITH DIFFERENTIAL/PLATELET
Abs Immature Granulocytes: 0.04 10*3/uL (ref 0.00–0.07)
Basophils Absolute: 0.1 10*3/uL (ref 0.0–0.1)
Basophils Relative: 1 %
Eosinophils Absolute: 0.1 10*3/uL (ref 0.0–0.5)
Eosinophils Relative: 1 %
HCT: 39.4 % (ref 36.0–46.0)
Hemoglobin: 13.9 g/dL (ref 12.0–15.0)
Immature Granulocytes: 0 %
Lymphocytes Relative: 25 %
Lymphs Abs: 2.5 10*3/uL (ref 0.7–4.0)
MCH: 31 pg (ref 26.0–34.0)
MCHC: 35.3 g/dL (ref 30.0–36.0)
MCV: 87.9 fL (ref 80.0–100.0)
Monocytes Absolute: 0.7 10*3/uL (ref 0.1–1.0)
Monocytes Relative: 7 %
Neutro Abs: 6.4 10*3/uL (ref 1.7–7.7)
Neutrophils Relative %: 66 %
Platelets: 313 10*3/uL (ref 150–400)
RBC: 4.48 MIL/uL (ref 3.87–5.11)
RDW: 13 % (ref 11.5–15.5)
WBC: 9.8 10*3/uL (ref 4.0–10.5)
nRBC: 0 % (ref 0.0–0.2)

## 2023-01-23 LAB — POCT INFLUENZA A/B
Influenza A, POC: NEGATIVE
Influenza B, POC: NEGATIVE

## 2023-01-23 MED ORDER — BENZONATATE 200 MG PO CAPS: 200.0000 mg | ORAL_CAPSULE | Freq: Three times a day (TID) | ORAL | 0 refills | Status: DC | PRN

## 2023-01-23 NOTE — Discharge Instructions (Signed)
 Lo atendieron hoy por sntomas de las vas respiratorias superiores.  Su prueba de gripe fue negativa.  Probablemente esto sea viral.  Le he enviado un medicamento para ayudar con la tos.  Su dolor puede ser un tirn muscular, pero tambin orden anlisis de sangre para estar seguro.  Esto se ver maana y puedes verlo en mi grfico.  Recibir una llamada telefnica si hay algo anormal o preocupante.  Si su dolor empeora o no mejora, regrese o vaya a la sala de emergencias para una evaluacin adicional.  You were seen today for upper respiratory symptoms.  Your flu swab was negative.  This is likely viral.  I have sent out a medication to help with the cough.  Your pain may be a pulled muscle, but I have also ordered blood work to be sure.  This will be resulted tomorrow and you can see this on my chart.  You will get a phone call if anything is abnormal or concerning.  If you have worsening pain or not improving then please return or go to the ER for further evaluation.

## 2023-01-23 NOTE — ED Provider Notes (Signed)
 [facility]    CSN: [id number] Arrival date & time: 01/23/23  1207      History   Chief Complaint Chief Complaint  Patient presents with   Fever   Cough   rib cage pain    HPI Megan Buchanan is a 38 y.o. female.    Fever Associated symptoms: congestion, cough and sore throat   Cough Associated symptoms: fever and sore throat    Spanish interpreter used today.  She is here for not feeling well.  She has had a fever x 3 days, as well as cough, sore throat.  The main concern is that she feels pain/soreness to the rib at the right.  The pain is more severe with taking a deep breath.  It is a sharp pain.  She is getting burping/gas when eating.  No sick contacts.  She has taken theraful and BC for her symptoms.        Past Medical History:  Diagnosis Date   Depression    doing ok   Shortness of breath     Patient Active Problem List   Diagnosis Date Noted   PROM (premature rupture of membranes) 09/13/2017   History of cesarean section 09/13/2017   VBAC, delivered 09/13/2017   Routine postpartum follow-up 01/06/2013   Joint stiffness 11/13/2012   Carpal tunnel syndrome 10/27/2012   Rectal bleeding 10/27/2012   Dysuria 09/10/2012   Supervision of other normal pregnancy 06/18/2012    Past Surgical History:  Procedure Laterality Date   CESAREAN SECTION     HYSTEROSCOPY WITH D & C  01/24/2011   Procedure: DILATATION AND CURETTAGE /HYSTEROSCOPY;  Surgeon: Gloris DELENA Hugger, MD;  Location: [facility];  Service: Gynecology;  Laterality: N/A;  Hysteroscopy only   IUD REMOVAL  01/24/2011   Procedure: INTRAUTERINE DEVICE (IUD) REMOVAL;  Surgeon: Gloris DELENA Hugger, MD;  Location: [facility];  Service: Gynecology;  Laterality: N/A;    OB History     Gravida  3   Para  3   Term  3   Preterm  0   AB  0   Living  3      SAB  0   IAB  0   Ectopic  0   Multiple      Live Births  3            Home Medications    Prior to Admission  medications   Medication Sig Start Date End Date Taking? Authorizing Provider  Prenatal Vit-Fe Fumarate-FA (PRENATAL MULTIVITAMIN) TABS tablet Take 1 tablet by mouth daily at 12 noon.    [provider]    Family History Family History  Problem Relation Age of Onset   Arthritis Mother    Hyperlipidemia Father     Social History Social History   Tobacco Use   Smoking status: Never   Smokeless tobacco: Never  Vaping Use   Vaping status: Never Used  Substance Use Topics   Alcohol use: No   Drug use: No     Allergies   Patient has no known allergies.   Review of Systems Review of Systems  Constitutional:  Positive for fever.  HENT:  Positive for congestion and sore throat.   Respiratory:  Positive for cough.   Cardiovascular: Negative.   Gastrointestinal:  Positive for abdominal pain.  Musculoskeletal: Negative.   Psychiatric/Behavioral: Negative.       Physical Exam Triage Vital Signs ED Triage Vitals  Encounter Vitals Group  BP 01/23/23 1323 104/76     Systolic BP Percentile --      Diastolic BP Percentile --      Pulse Rate 01/23/23 1323 94     Resp 01/23/23 1323 16     Temp 01/23/23 1323 98.3 F (36.8 C)     Temp Source 01/23/23 1323 Oral     SpO2 01/23/23 1323 97 %     Weight --      Height --      Head Circumference --      Peak Flow --      Pain Score 01/23/23 1320 9     Pain Loc --      Pain Education --      Exclude from Growth Chart --    No data found.  Updated Vital Signs BP 104/76 (BP Location: Right Arm)   Pulse 94   Temp 98.3 F (36.8 C) (Oral)   Resp 16   LMP 12/20/2022   SpO2 97%   Visual Acuity Right Eye Distance:   Left Eye Distance:   Bilateral Distance:    Right Eye Near:   Left Eye Near:    Bilateral Near:     Physical Exam Constitutional:      General: She is not in acute distress.    Appearance: Normal appearance. She is not ill-appearing.  HENT:     Right Ear: Tympanic membrane normal.     Left  Ear: Tympanic membrane normal.     Nose: Congestion present.     Mouth/Throat:     Mouth: Mucous membranes are moist.  Cardiovascular:     Rate and Rhythm: Normal rate and regular rhythm.  Pulmonary:     Effort: Pulmonary effort is normal.     Breath sounds: Normal breath sounds.  Abdominal:     Palpations: Abdomen is soft.     Comments: TTP to the RUQ;  no guarding/rebound  Musculoskeletal:        General: Normal range of motion.     Cervical back: Normal range of motion and neck supple. No tenderness.     Comments: No rib pain noted  Skin:    General: Skin is warm.  Neurological:     General: No focal deficit present.     Mental Status: She is alert.  Psychiatric:        Mood and Affect: Mood normal.      UC Treatments / Results  Labs (all labs ordered are listed, but only abnormal results are displayed) Flu negative  EKG   Radiology No results found.  Procedures Procedures (including critical care time)  Medications Ordered in UC Medications - No data to display  Initial Impression / Assessment and Plan / UC Course  I have reviewed the triage vital signs and the nursing notes.  Pertinent labs & imaging results that were available during my care of the patient were reviewed by me and considered in my medical decision making (see chart for details).   Final Clinical Impressions(s) / UC Diagnoses   Final diagnoses:  Upper respiratory tract infection, unspecified type  RUQ pain     Discharge Instructions      Lo atendieron hoy por sntomas de las vas respiratorias superiores.  Su prueba de gripe fue negativa.  Probablemente esto sea viral.  Le he enviado un medicamento para ayudar con la tos.  Su dolor puede ser un tirn muscular, pero tambin orden anlisis de sangre para estar seguro.  Esto se  ver maana y puedes verlo en mi grfico.  Recibir una llamada telefnica si hay algo anormal o preocupante.  Si su dolor empeora o no mejora, regrese o vaya a  la sala de emergencias para una evaluacin adicional.  You were seen today for upper respiratory symptoms.  Your flu swab was negative.  This is likely viral.  I have sent out a medication to help with the cough.  Your pain may be a pulled muscle, but I have also ordered blood work to be sure.  This will be resulted tomorrow and you can see this on my chart.  You will get a phone call if anything is abnormal or concerning.  If you have worsening pain or not improving then please return or go to the ER for further evaluation.     ED Prescriptions     Medication Sig Dispense Auth. Provider   benzonatate  (TESSALON ) 200 MG capsule Take 1 capsule (200 mg total) by mouth 3 (three) times daily as needed for cough. 21 capsule Darral Longs, MD      PDMP not reviewed this encounter.   Darral Longs, MD 01/23/23 1420

## 2023-01-23 NOTE — ED Triage Notes (Addendum)
 Per Interpreter Darin [phone]  Patient reports that she has had a cough x 3 weeks and developed a fever 3 days ago as high as 102.0 and right rib cage pain as well. Patient added that she has a sore throat and left ear discomfort.  Patient states she has been taking Tylenol  and TheraFlu.

## 2023-11-06 ENCOUNTER — Ambulatory Visit: Payer: Self-pay | Admitting: Internal Medicine

## 2023-11-06 ENCOUNTER — Encounter: Payer: Self-pay | Admitting: Internal Medicine

## 2023-11-06 VITALS — BP 118/85 | HR 100 | Resp 17 | Ht <= 58 in | Wt 161.0 lb

## 2023-11-06 DIAGNOSIS — R5383 Other fatigue: Secondary | ICD-10-CM

## 2023-11-06 DIAGNOSIS — F339 Major depressive disorder, recurrent, unspecified: Secondary | ICD-10-CM

## 2023-11-06 DIAGNOSIS — G5603 Carpal tunnel syndrome, bilateral upper limbs: Secondary | ICD-10-CM

## 2023-11-06 DIAGNOSIS — Z23 Encounter for immunization: Secondary | ICD-10-CM

## 2023-11-06 DIAGNOSIS — E669 Obesity, unspecified: Secondary | ICD-10-CM

## 2023-11-06 NOTE — Progress Notes (Signed)
 "   Subjective:    Patient ID: Megan Buchanan, female   DOB: [birth year], 38 y.o.   MRN: [id number]   HPI  Here to establish Hargis Coe interprets   Hands and feet hurt when she has her period.  Feels like her hands harden, her hands are numb and her right hand gets more numb with tingling.  Feels similar with feet.  Has not taken anything for this.  Can awaken her at night.  Does not work outside home. Also, notes irritability during her period and can be before, during and after her period.  Has been a problem in just the last year.   2.  Has gained weight--states about 30 lbs over past 6 months.  She has weighed this amount in past, has lost the weight and in past 6 months regained again :  feels she is eating healthy, being physically active, but still gaining weight.   Feels fatigued and lacks motivation.   Hair is falling out from roots for about 1 month.  Skin is fine.  No dryness Positive for constipation and bloating for about 2 months.   She is also having acid reflux for about 2 months as well.   Using Maalox when she feels full and when reflux worse.  Generally, after eating meat.   No melena or hematochezia.   Has not been evaluated for this elsewhere.    3.  Lack of Motivation:  Feels she could be depressed.  States initially, she has never been depressed, but when asked about this as a diagnosis in her chart, she states she did have depression with last pregnancy.  She was to be treated with at [facility], but did not follow up and gradually felt better.    4.  Tachypalpitations at night that can awaken her from sleep.  Occurs once a week for about 1 year.    No outpatient medications have been marked as taking for the 11/06/23 encounter (Office Visit) with Adella Norris, MD.   No Known Allergies  Past Medical History:  Diagnosis Date   Depression    doing ok   Shortness of breath    Past Surgical History:  Procedure Laterality Date   CESAREAN SECTION   2008   HYSTEROSCOPY WITH D & C  01/24/2011   Procedure: DILATATION AND CURETTAGE /HYSTEROSCOPY;  Surgeon: Gloris DELENA Hugger, MD;  Location: [facility];  Service: Gynecology;  Laterality: N/A;  Hysteroscopy only   IUD REMOVAL  01/24/2011   Procedure: INTRAUTERINE DEVICE (IUD) REMOVAL;  Surgeon: Gloris DELENA Hugger, MD;  Location: [facility];  Service: Gynecology;  Laterality: N/A;   Family History  Problem Relation Age of Onset   Arthritis Mother        Rheumatoid   Early menopause Mother 67   Hyperlipidemia Father    Psoriasis Sister        one maternal half sister   Social History   Socioeconomic History   Marital status: Married    Spouse name: Fredderick Halo   Number of children: 2   Years of education: 9   Highest education level: 9th grade  Occupational History   Occupation: Housewife  Tobacco Use   Smoking status: Never    Passive exposure: Never   Smokeless tobacco: Never  Vaping Use   Vaping status: Never Used  Substance and Sexual Activity   Alcohol use: No   Drug use: No   Sexual activity: Yes    Comment: Husband with vasectomy  Other Topics Concern   Not on file  Social History Narrative   Lives at home with her husband, her mother, her mother in law, and her 3 children   Social Drivers of Health   Tobacco Use: Low Risk (11/06/2023)   Patient History    Smoking Tobacco Use: Never    Smokeless Tobacco Use: Never    Passive Exposure: Never  Financial Resource Strain: Not on file  Food Insecurity: Unknown (11/06/2023)   Epic    Worried About Programme Researcher, Broadcasting/film/video in the Last Year: Not on file    [employer] of Food in the Last Year: Never true  Transportation Needs: No Transportation Needs (11/06/2023)   Epic    Lack of Transportation (Medical): No    Lack of Transportation (Non-Medical): No  Physical Activity: Not on file  Stress: Not on file  Social Connections: Not on file  Intimate Partner Violence: Not At Risk (11/06/2023)   Epic    Fear of Current or  Ex-Partner: No    Emotionally Abused: No    Physically Abused: No    Sexually Abused: No  Depression (PHQ2-9): High Risk (11/06/2023)   Depression (PHQ2-9)    PHQ-2 Score: 18  Alcohol Screen: Not on file  Housing: Low Risk (11/06/2023)   Epic    Unable to Pay for Housing in the Last Year: No    Number of Times Moved in the Last Year: 0    Homeless in the Last Year: No  Utilities: Not At Risk (11/06/2023)   Epic    Threatened with loss of utilities: No  Health Literacy: Not on file      Review of Systems    Objective:   BP 118/85 (BP Location: Left Arm, Patient Position: Sitting)   Pulse 100   Resp 17   Ht 4' 10 (1.473 m)   Wt 161 lb (73 kg)   LMP 10/26/2023 (Exact Date)   BMI 33.65 kg/m   Physical Exam Constitutional:      Appearance: She is obese.  HENT:     Head: Normocephalic and atraumatic.     Right Ear: Tympanic membrane, ear canal and external ear normal.     Left Ear: Tympanic membrane, ear canal and external ear normal.     Nose: Nose normal.     Mouth/Throat:     Mouth: Mucous membranes are moist.     Pharynx: Oropharynx is clear.  Eyes:     Extraocular Movements: Extraocular movements intact.     Conjunctiva/sclera: Conjunctivae normal.     Pupils: Pupils are equal, round, and reactive to light.  Neck:     Thyroid: No thyroid mass or thyromegaly.  Cardiovascular:     Rate and Rhythm: Normal rate and regular rhythm.     Pulses: Normal pulses.     Heart sounds: S1 normal and S2 normal. No murmur heard.    No friction rub. No S3 or S4 sounds.  Pulmonary:     Effort: Pulmonary effort is normal.     Breath sounds: Normal breath sounds and air entry.  Abdominal:     General: Bowel sounds are normal.     Palpations: Abdomen is soft. There is no hepatomegaly, splenomegaly or mass.     Tenderness: There is no abdominal tenderness.     Hernia: No hernia is present.  Musculoskeletal:        General: Normal range of motion.     Cervical back: Normal  range of motion  and neck supple.     Right lower leg: No edema.     Left lower leg: No edema.  Lymphadenopathy:     Head:     Right side of head: No submental or submandibular adenopathy.     Left side of head: No submental or submandibular adenopathy.     Cervical: No cervical adenopathy.     Upper Body:     Right upper body: No supraclavicular adenopathy.     Left upper body: No supraclavicular adenopathy.  Skin:    General: Skin is warm.     Capillary Refill: Capillary refill takes less than 2 seconds.     Findings: No rash.  Neurological:     General: No focal deficit present.     Mental Status: She is alert and oriented to person, place, and time.     Cranial Nerves: Cranial nerves 2-12 are intact.     Sensory: Sensation is intact.     Motor: Motor function is intact.     Coordination: Coordination is intact.     Gait: Gait is intact.     Deep Tendon Reflexes: Reflexes are normal and symmetric.     Comments: + Tinels and Phalens over median nerve at volar wrists bilaterally  Psychiatric:        Mood and Affect: Mood normal.        Behavior: Behavior normal.      Assessment & Plan   Bilateral Carpal Tunnel Syndrome:  Discussed obtaining cock up splints to wear nightly.  Ibuprofen  400-600 mg twice daily for 2 weeks Sleep with hands on top of covers at night.  2.  Fatigue:  Feel her symptoms most likely due to depression:  Will discuss in conference with SW.  Feel she needs counseling.  CBC, CMP, TSH, FT4  3.  Obesity:  to work on lifestyle changes.  Encouraged small goals.  Lab evaluation as above. A1C  4.  HM:  FLP, influenza vaccine.  Schedule for CPE "

## 2023-11-06 NOTE — Progress Notes (Signed)
 Patient seen by Megan Buchanan.   Screenings for depression, generalized anxiety and SDOH as below     11/06/2023   11:19 AM 01/06/2013    9:54 AM  Depression screen PHQ 2/9  Decreased Interest 1 0  Down, Depressed, Hopeless 2 0  PHQ - 2 Score 3 0  Altered sleeping 3   Tired, decreased energy 3   Change in appetite 1   Feeling bad or failure about yourself  3   Trouble concentrating 3   Moving slowly or fidgety/restless 2   Suicidal thoughts 0   PHQ-9 Score 18   Difficult doing work/chores Somewhat difficult       11/06/2023   11:21 AM  GAD 7 : Generalized Anxiety Score  Nervous, Anxious, on Edge 3  Control/stop worrying 1  Worry too much - different things 0  Trouble relaxing 2  Restless 1  Easily annoyed or irritable 3  Afraid - awful might happen 0  Total GAD 7 Score 10  Anxiety Difficulty Somewhat difficult    SDOH Screenings   Food Insecurity: Unknown (11/06/2023)  Housing: Low Risk  (11/06/2023)  Transportation Needs: No Transportation Needs (11/06/2023)  Utilities: Not At Risk (11/06/2023)  Depression (PHQ2-9): High Risk (11/06/2023)  Tobacco Use: Low Risk  (11/06/2023)   Patient offered counseling

## 2023-11-07 ENCOUNTER — Ambulatory Visit: Payer: Self-pay | Admitting: Internal Medicine

## 2023-11-07 LAB — LIPID PANEL W/O CHOL/HDL RATIO
Cholesterol, Total: 181 mg/dL (ref 100–199)
HDL: 66 mg/dL (ref >39)
LDL Chol Calc (NIH): 95 mg/dL (ref 0–99)
Triglycerides: 114 mg/dL (ref 0–149)
VLDL Cholesterol Cal: 20 mg/dL (ref 5–40)

## 2023-11-07 LAB — COMPREHENSIVE METABOLIC PANEL WITH GFR
ALT: 27 IU/L (ref 0–32)
AST: 27 IU/L (ref 0–40)
Albumin: 5 g/dL — ABNORMAL HIGH (ref 3.9–4.9)
Alkaline Phosphatase: 70 IU/L (ref 41–116)
BUN/Creatinine Ratio: 18 (ref 9–23)
BUN: 13 mg/dL (ref 6–20)
Bilirubin Total: 0.4 mg/dL (ref 0.0–1.2)
CO2: 22 mmol/L (ref 20–29)
Calcium: 10 mg/dL (ref 8.7–10.2)
Chloride: 98 mmol/L (ref 96–106)
Creatinine, Ser: 0.71 mg/dL (ref 0.57–1.00)
Globulin, Total: 3.5 g/dL (ref 1.5–4.5)
Glucose: 93 mg/dL (ref 70–99)
Potassium: 4.3 mmol/L (ref 3.5–5.2)
Sodium: 139 mmol/L (ref 134–144)
Total Protein: 8.5 g/dL (ref 6.0–8.5)
eGFR: 111 mL/min/1.73 (ref >59)

## 2023-11-07 LAB — CBC WITH DIFFERENTIAL/PLATELET
Basophils Absolute: 0.1 x10E3/uL (ref 0.0–0.2)
Basos: 1 %
EOS (ABSOLUTE): 0 x10E3/uL (ref 0.0–0.4)
Eos: 0 %
Hematocrit: 44.7 % (ref 34.0–46.6)
Hemoglobin: 15 g/dL (ref 11.1–15.9)
Immature Grans (Abs): 0 x10E3/uL (ref 0.0–0.1)
Immature Granulocytes: 0 %
Lymphocytes Absolute: 2.7 x10E3/uL (ref 0.7–3.1)
Lymphs: 31 %
MCH: 31.6 pg (ref 26.6–33.0)
MCHC: 33.6 g/dL (ref 31.5–35.7)
MCV: 94 fL (ref 79–97)
Monocytes Absolute: 0.6 x10E3/uL (ref 0.1–0.9)
Monocytes: 7 %
Neutrophils Absolute: 5.4 x10E3/uL (ref 1.4–7.0)
Neutrophils: 61 %
Platelets: 353 x10E3/uL (ref 150–450)
RBC: 4.74 x10E6/uL (ref 3.77–5.28)
RDW: 13 % (ref 11.7–15.4)
WBC: 8.8 x10E3/uL (ref 3.4–10.8)

## 2023-11-07 LAB — T4, FREE: Free T4: 1.42 ng/dL (ref 0.82–1.77)

## 2023-11-07 LAB — TSH: TSH: 3.16 u[IU]/mL (ref 0.450–4.500)

## 2023-11-07 LAB — HGB A1C W/O EAG: Hgb A1c MFr Bld: 5.6 % (ref 4.8–5.6)

## 2023-11-21 ENCOUNTER — Ambulatory Visit: Payer: Self-pay | Admitting: Psychology

## 2023-11-21 DIAGNOSIS — F419 Anxiety disorder, unspecified: Secondary | ICD-10-CM

## 2023-11-21 DIAGNOSIS — F331 Major depressive disorder, recurrent, moderate: Secondary | ICD-10-CM

## 2023-11-28 ENCOUNTER — Other Ambulatory Visit: Payer: Self-pay | Admitting: Psychology

## 2023-11-28 NOTE — Progress Notes (Deleted)
 Comprehensive Clinical Assessment (CCA) Note  11/28/2023 ONNA NODAL [id number]  Chief Complaint: No chief complaint on file.  Visit Diagnosis: ***    CCA Screening, Triage and Referral (STR)  Patient Reported Information How did you hear about us ? No data recorded Referral name: No data recorded Referral phone number: No data recorded  Whom do you see for routine medical problems? No data recorded Practice/Facility Name: No data recorded Practice/Facility Phone Number: No data recorded Name of Contact: No data recorded Contact Number: No data recorded Contact Fax Number: No data recorded Prescriber Name: No data recorded Prescriber Address (if known): No data recorded  What Is the Reason for Your Visit/Call Today? No data recorded How Long Has This Been Causing You Problems? No data recorded What Do You Feel Would Help You the Most Today? No data recorded  Have You Recently Been in Any Inpatient Treatment (Hospital/Detox/Crisis Center/28-Day Program)? No data recorded Name/Location of Program/Hospital:No data recorded How Long Were You There? No data recorded When Were You Discharged? No data recorded  Have You Ever Received Services From [facility] Before? No data recorded Who Do You See at [facility]? No data recorded   Have You Recently Had Any Thoughts About Hurting Yourself? No data recorded Are You Planning to Commit Suicide/Harm Yourself At This time? No data recorded  Have you Recently Had Thoughts About Hurting Someone Sherral? No data recorded Explanation: No data recorded  Have You Used Any Alcohol or Drugs in the Past 24 Hours? No data recorded How Long Ago Did You Use Drugs or Alcohol? No data recorded What Did You Use and How Much? No data recorded  Do You Currently Have a Therapist/Psychiatrist? No data recorded Name of Therapist/Psychiatrist: No data recorded  Have You Been Recently Discharged From Any Office Practice or Programs? No data  recorded Explanation of Discharge From Practice/Program: No data recorded    CCA Screening Triage Referral Assessment Type of Contact: No data recorded Is this Initial or Reassessment? No data recorded Date Telepsych consult ordered in [facility]:  No data recorded Time Telepsych consult ordered in [facility]:  No data recorded  Patient Reported Information Reviewed? No data recorded Patient Left Without Being Seen? No data recorded Reason for Not Completing Assessment: No data recorded  Collateral Involvement: No data recorded  Does Patient Have a Court Appointed Legal Guardian? No data recorded Name and Contact of Legal Guardian: No data recorded If Minor and Not Living with Parent(s), Who has Custody? No data recorded Is CPS involved or ever been involved? No data recorded Is APS involved or ever been involved? No data recorded  Patient Determined To Be At Risk for Harm To Self or Others Based on Review of Patient Reported Information or Presenting Complaint? No data recorded Method: No data recorded Availability of Means: No data recorded Intent: No data recorded Notification Required: No data recorded Additional Information for Danger to Others Potential: No data recorded Additional Comments for Danger to Others Potential: No data recorded Are There Guns or Other Weapons in Your Home? No data recorded Types of Guns/Weapons: No data recorded Are These Weapons Safely Secured?                            No data recorded Who Could Verify You Are Able To Have These Secured: No data recorded Do You Have any Outstanding Charges, Pending Court Dates, Parole/Probation? No data recorded Contacted To Inform of Risk of  Harm To Self or Others: No data recorded  Location of Assessment: No data recorded  Does Patient Present under Involuntary Commitment? No data recorded IVC Papers Initial File Date: No data recorded  [place] of Residence: No data recorded  Patient Currently Receiving the Following  Services: No data recorded  Determination of Need: No data recorded  Options For Referral: No data recorded    CCA Biopsychosocial Intake/Chief Complaint:  No data recorded Current Symptoms/Problems: No data recorded  Patient Reported Schizophrenia/Schizoaffective Diagnosis in Past: No data recorded  Strengths: No data recorded Preferences: No data recorded Abilities: No data recorded  Type of Services Patient Feels are Needed: No data recorded  Initial Clinical Notes/Concerns: No data recorded  Mental Health Symptoms Depression:  No data recorded  Duration of Depressive symptoms: No data recorded  Mania:  No data recorded  Anxiety:   No data recorded  Psychosis:  No data recorded  Duration of Psychotic symptoms: No data recorded  Trauma:  No data recorded  Obsessions:  No data recorded  Compulsions:  No data recorded  Inattention:  No data recorded  Hyperactivity/Impulsivity:  No data recorded  Oppositional/Defiant Behaviors:  No data recorded  Emotional Irregularity:  No data recorded  Other Mood/Personality Symptoms:  No data recorded   Mental Status Exam Appearance and self-care  Stature:  No data recorded  Weight:  No data recorded  Clothing:  No data recorded  Grooming:  No data recorded  Cosmetic use:  No data recorded  Posture/gait:  No data recorded  Motor activity:  No data recorded  Sensorium  Attention:  No data recorded  Concentration:  No data recorded  Orientation:  No data recorded  Recall/memory:  No data recorded  Affect and Mood  Affect:  No data recorded  Mood:  No data recorded  Relating  Eye contact:  No data recorded  Facial expression:  No data recorded  Attitude toward examiner:  No data recorded  Thought and Language  Speech flow: No data recorded  Thought content:  No data recorded  Preoccupation:  No data recorded  Hallucinations:  No data recorded  Organization:  No data recorded  [employer] of Knowledge:   No data recorded  Intelligence:  No data recorded  Abstraction:  No data recorded  Judgement:  No data recorded  Reality Testing:  No data recorded  Insight:  No data recorded  Decision Making:  No data recorded  Social Functioning  Social Maturity:  No data recorded  Social Judgement:  No data recorded  Stress  Stressors:  No data recorded  Coping Ability:  No data recorded  Skill Deficits:  No data recorded  Supports:  No data recorded    Religion:    Leisure/Recreation:    Exercise/Diet:     CCA Employment/Education Employment/Work Situation:    Education:     CCA Family/Childhood History Family and Relationship History:    Childhood History:     Child/Adolescent Assessment:     CCA Substance Use Alcohol/Drug Use:                           ASAM's:  Six Dimensions of Multidimensional Assessment  Dimension 1:  Acute Intoxication and/or Withdrawal Potential:      Dimension 2:  Biomedical Conditions and Complications:      Dimension 3:  Emotional, Behavioral, or Cognitive Conditions and Complications:     Dimension 4:  Readiness to Change:  Dimension 5:  Relapse, Continued use, or Continued Problem Potential:     Dimension 6:  Recovery/Living Environment:     ASAM Severity Score:    ASAM Recommended Level of Treatment:     Substance use Disorder (SUD)    Recommendations for Services/Supports/Treatments:    DSM5 Diagnoses: Patient Active Problem List   Diagnosis Date Noted   PROM (premature rupture of membranes) 09/13/2017   History of cesarean section 09/13/2017   VBAC, delivered 09/13/2017   Routine postpartum follow-up 01/06/2013   Joint stiffness 11/13/2012   Carpal tunnel syndrome 10/27/2012   Rectal bleeding 10/27/2012   Dysuria 09/10/2012   Supervision of other normal pregnancy 06/18/2012    Patient Centered Plan: Patient is on the following Treatment Plan(s):  {[facility] AMB BH OP Treatment  Plans:21091129}   Referrals to Alternative Service(s): Referred to Alternative Service(s):   Place:   Date:   Time:    Referred to Alternative Service(s):   Place:   Date:   Time:    Referred to Alternative Service(s):   Place:   Date:   Time:    Referred to Alternative Service(s):   Place:   Date:   Time:      Collaboration of Care: {BH OP Collaboration of Care:[id number]}  Patient/Guardian was advised Release of Information must be obtained prior to any record release in order to collaborate their care with an outside provider. Patient/Guardian was advised if they have not already done so to contact the registration department to sign all necessary forms in order for us  to release information regarding their care.   Consent: Patient/Guardian gives verbal consent for treatment and assignment of benefits for services provided during this visit. Patient/Guardian expressed understanding and agreed to proceed.   Lakaya Tolen, Student-Social Work

## 2023-11-28 NOTE — Progress Notes (Signed)
 Client Name: Megan Buchanan Date: 11/21/2023 Duration: 60 min Clinician/Intern: Molly Grana, MSW Intern  Data: Intern and client met for the initial session. Client is a 38 year old female who lives with her husband, three children, mother, and mother-in-law. She was scheduled for counseling after her PHQ-9 and GAD-7 screening scores totaled 18 and 10, respectively. During the session, client reported feeling unmotivated, tired, lazy, unable to sleep at night, and having constant racing thoughts. 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 Client identified that her problems began with her parents' divorce. She stated that she feels stressed by her responsibilities, her parents' separation, and anger toward her father, who now has a partner and a baby. Client expressed a desire to improve her emotional well-being and stated that she does not want to continue feeling the way she currently does.   Assessment: Client arrived on time and was prepared for the session. She was present and oriented throughout. Client preferred a Spanish interpreter, which the intern utilized. Halfway through the session, the interpreter disconnected, and Client reported she was okay to continue in English. When discussing her stressors, including her father and her parents' divorce, she became visibly tearful. Client's reports of lack of motivation, difficulty sleeping, constant worrying, and low energy that have impacted her work are consistent with symptoms of moderate depression and anxiety. Identified strengths include support from her husband, three children, and friends, as well as her willingness to engage in therapy. Noted challenges include limited self-care practices and difficulties establishing boundaries. No current safety or risk concerns were reported or observed.  Plan: Intern and client agreed to meet weekly for one-hour  counseling sessions. In future sessions, intern will continue to build the therapeutic relationship, assist the client in developing coping mechanisms, and create a safe space for her to share her thoughts and feelings. Progress will be evaluated using the PHQ-9 and GAD-7 at regular intervals.  Clinician Signature: Molly Grana, MSW Intern

## 2024-01-24 NOTE — Progress Notes (Signed)
 Client Name: Tienna Bienkowski Date: 11/21/2023 Duration: 60 min Clinician/Intern: Molly Grana, MSW Intern  Data: Intern and client met for the initial session. Client is a 39 year old female who lives with her husband, three children, mother, and mother-in-law. She was scheduled for counseling after her PHQ-9 and GAD-7 screening scores totaled 18 and 10, respectively. During the session, client reported feeling unmotivated, tired, lazy, unable to sleep at night, and having constant racing thoughts. 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 Client identified that her problems began with her parents' divorce. She stated that she feels stressed by her responsibilities, her parents' separation, and anger toward her father, who now has a partner and a baby. Client expressed a desire to improve her emotional well-being and stated that she does not want to continue feeling the way she currently does.   Assessment: Client arrived on time and was prepared for the session. She was present and oriented throughout. Client preferred a Spanish interpreter, which the intern utilized. Halfway through the session, the interpreter disconnected, and Client reported she was okay to continue in English. When discussing her stressors, including her father and her parents' divorce, she became visibly tearful. Client's reports of lack of motivation, difficulty sleeping, constant worrying, and low energy that have impacted her work are consistent with symptoms of moderate depression and anxiety. Identified strengths include support from her husband, three children, and friends, as well as her willingness to engage in therapy. Noted challenges include limited self-care practices and difficulties establishing boundaries. No current safety or risk concerns were reported or observed.  Plan: Intern and client agreed to meet weekly for one-hour  counseling sessions. In future sessions, intern will continue to build the therapeutic relationship, assist the client in developing coping mechanisms, and create a safe space for her to share her thoughts and feelings. Progress will be evaluated using the PHQ-9 and GAD-7 at regular intervals.  Clinician Signature: Molly Grana, MSW Intern

## 2024-01-31 ENCOUNTER — Other Ambulatory Visit: Payer: Self-pay | Admitting: Psychology

## 2024-01-31 NOTE — Progress Notes (Unsigned)
 Client Name: Megan Buchanan Date: 01/31/2023 Clinician/Intern: Molly Grana, MSW Intern  Case Management: The intern checked in with the client via phone. The client reported that she is feeling better and does not wish to continue therapy services at this time. The intern offered support and informed the client that she may return for services in the future if needed.  Clinician Signature: Molly Grana, MSW Intern

## 2024-02-10 ENCOUNTER — Ambulatory Visit: Payer: Self-pay | Admitting: Internal Medicine

## 2024-02-10 DIAGNOSIS — E669 Obesity, unspecified: Secondary | ICD-10-CM | POA: Insufficient documentation

## 2024-02-10 DIAGNOSIS — F339 Major depressive disorder, recurrent, unspecified: Secondary | ICD-10-CM | POA: Insufficient documentation

## 2024-02-10 DIAGNOSIS — R5383 Other fatigue: Secondary | ICD-10-CM | POA: Insufficient documentation

## 2024-02-12 NOTE — Progress Notes (Signed)
 We were planning to discss at meeting today. She came in for couseling. She had a good session with Htay. For a little while they went back and forth with schedules. Last week, she states she is good and no longer needs counseling. Will discuss more at length with you today.

## 2024-05-26 ENCOUNTER — Encounter: Payer: Self-pay | Admitting: Internal Medicine
